# Patient Record
Sex: Female | Born: 1935
Health system: Southern US, Community
[De-identification: ages and names within clinical notes are randomized; demographics above are authoritative.]

## PROBLEM LIST (undated history)

## (undated) DIAGNOSIS — M438X9 Other specified deforming dorsopathies, site unspecified: Secondary | ICD-10-CM

## (undated) DIAGNOSIS — I10 Essential (primary) hypertension: Secondary | ICD-10-CM

## (undated) DIAGNOSIS — M810 Age-related osteoporosis without current pathological fracture: Secondary | ICD-10-CM

## (undated) HISTORY — PX: KYPHOPLASTY: SHX5884

## (undated) HISTORY — DX: Essential (primary) hypertension: I10

## (undated) HISTORY — PX: OTHER SURGICAL HISTORY: SHX169

---
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
## 1958-12-11 HISTORY — PX: DG GALL BLADDER: HXRAD326

## 2019-10-16 ENCOUNTER — Ambulatory Visit (INDEPENDENT_AMBULATORY_CARE_PROVIDER_SITE_OTHER): Payer: Medicare HMO

## 2019-10-16 ENCOUNTER — Other Ambulatory Visit: Payer: Self-pay

## 2019-10-16 ENCOUNTER — Ambulatory Visit (INDEPENDENT_AMBULATORY_CARE_PROVIDER_SITE_OTHER): Payer: Medicare HMO | Admitting: Orthopedic Surgery

## 2019-10-16 DIAGNOSIS — M545 Low back pain, unspecified: Secondary | ICD-10-CM

## 2019-10-16 DIAGNOSIS — Z8781 Personal history of (healed) traumatic fracture: Secondary | ICD-10-CM | POA: Diagnosis not present

## 2019-10-16 DIAGNOSIS — G8929 Other chronic pain: Secondary | ICD-10-CM

## 2019-10-16 NOTE — Progress Notes (Signed)
Office Visit Note   Patient: Julia Clark           Date of Birth: 12-02-1930           MRN: [id number] Visit Date: 10/16/2019 Requested by: No referring provider defined for this encounter. PCP: Mellody Dance, DO  Subjective: Chief Complaint  Patient presents with  . Lower Back - Pain  . Middle Back - Pain    HPI: Leotha Shorr is a 85 y.o. female who presents to the office complaining of history of back pain.  Patient notes that she just moved to the area from [place] and wants to establish care with the office.  She has a history of severe osteoporosis with a history of multiple lumbar compression fractures that have been treated with kyphoplasty 3 times.  These lumbar compression fractures were not associated with any injuries.  She also has a history of epidural steroid injections.  She has no complaints of significant back pain today in clinic.  She gets occasional axial lumbar pain that is controlled with taking Motrin if she stands for long period of time.  Takes occasional Tylenol as well.  She has not had to take any recently.  She has had DEXA scans every 2 years with the last DEXA scan showing a T score of -4.5 according to her.  She has taken Fosamax before, up to 9 years.  She is not on Fosamax right now.  She denies any history of back surgeries besides the kyphoplasty's.              ROS:  All systems reviewed are negative as they relate to the chief complaint within the history of present illness.  Patient denies fevers or chills.  Assessment & Plan: Visit Diagnoses:  1. History of compression fracture of spine   2. Chronic low back pain, unspecified back pain laterality, unspecified whether sciatica present     Plan: Patient is an 85 year old female who presents with history of back pain to establish care with this office.  She has history of severe osteoporosis.  Baseline x-rays of the lumbar and thoracic spine obtained today.  X-rays show evidence of prior  compression fractures and kyphoplasties.  Patient has no significant findings on exam in clinic today.  Will order DEXA scan.  She has not on any medication for osteoporosis at this time.  Recommended that patient follow-up with her PCP regarding the DEXA scan and to decide on further treatment for osteoporosis.  Patient agreed with plan and will follow up with the office as needed.  Patient may need to be on another agent.  Her mechanism of injury for the compression fractures in the thoracic and lumbar spine was minimal at best.  Currently she is asymptomatic particularly in regards to both hips which would potentially be at risk for stress fracture due to her Fosamax treatment.  No symptoms there today.  Follow-Up Instructions: No follow-ups on file.   Orders:  Orders Placed This Encounter  Procedures  . XR Thoracic Spine 2 View  . XR Lumbar Spine 2-3 Views   No orders of the defined types were placed in this encounter.     Procedures: No procedures performed   Clinical Data: No additional findings.  Objective: Vital Signs: There were no vitals taken for this visit.  Physical Exam:  Constitutional: Patient appears well-developed HEENT:  Head: Normocephalic Eyes:EOM are normal Neck: Normal range of motion Cardiovascular: Normal rate Pulmonary/chest: Effort normal Neurologic: Patient is alert Skin:  Skin is warm Psychiatric: Patient has normal mood and affect  Ortho Exam:  No tenderness throughout the axial lumbar spine or paraspinal musculature 5/5 motor strength of the bilateral hip flexor, quadriceps, dorsiflexion, plantarflexion Sensation intact in all dermatomes throughout the bilateral lower extremities Normal reflexes through bilateral lower extremities Negative straight leg raise bilaterally  Specialty Comments:  No specialty comments available.  Imaging: No results found.   PMFS History: There are no active problems to display for this patient.  No past  medical history on file.  No family history on file.   Social History   Occupational History  . Not on file  Tobacco Use  . Smoking status: Not on file  Substance and Sexual Activity  . Alcohol use: Not on file  . Drug use: Not on file  . Sexual activity: Not on file

## 2019-10-17 ENCOUNTER — Encounter: Payer: Self-pay | Admitting: Orthopedic Surgery

## 2019-10-28 DIAGNOSIS — R69 Illness, unspecified: Secondary | ICD-10-CM | POA: Diagnosis not present

## 2019-11-17 ENCOUNTER — Ambulatory Visit (INDEPENDENT_AMBULATORY_CARE_PROVIDER_SITE_OTHER): Payer: Medicare HMO | Admitting: Family Medicine

## 2019-11-17 ENCOUNTER — Encounter: Payer: Self-pay | Admitting: Family Medicine

## 2019-11-17 ENCOUNTER — Other Ambulatory Visit: Payer: Self-pay

## 2019-11-17 VITALS — BP 156/92 | HR 77 | Temp 97.4°F | Resp 12 | Ht 63.39 in | Wt 130.9 lb

## 2019-11-17 DIAGNOSIS — Z5320 Procedure and treatment not carried out because of patient's decision for unspecified reasons: Secondary | ICD-10-CM | POA: Insufficient documentation

## 2019-11-17 DIAGNOSIS — E785 Hyperlipidemia, unspecified: Secondary | ICD-10-CM

## 2019-11-17 DIAGNOSIS — Z7689 Persons encountering health services in other specified circumstances: Secondary | ICD-10-CM | POA: Diagnosis not present

## 2019-11-17 DIAGNOSIS — M810 Age-related osteoporosis without current pathological fracture: Secondary | ICD-10-CM

## 2019-11-17 DIAGNOSIS — E559 Vitamin D deficiency, unspecified: Secondary | ICD-10-CM | POA: Insufficient documentation

## 2019-11-17 DIAGNOSIS — Z8679 Personal history of other diseases of the circulatory system: Secondary | ICD-10-CM | POA: Diagnosis not present

## 2019-11-17 DIAGNOSIS — I10 Essential (primary) hypertension: Secondary | ICD-10-CM | POA: Diagnosis not present

## 2019-11-17 DIAGNOSIS — M1990 Unspecified osteoarthritis, unspecified site: Secondary | ICD-10-CM | POA: Diagnosis not present

## 2019-11-17 DIAGNOSIS — Z8261 Family history of arthritis: Secondary | ICD-10-CM | POA: Insufficient documentation

## 2019-11-17 DIAGNOSIS — Z820 Family history of epilepsy and other diseases of the nervous system: Secondary | ICD-10-CM | POA: Insufficient documentation

## 2019-11-17 DIAGNOSIS — Z806 Family history of leukemia: Secondary | ICD-10-CM | POA: Insufficient documentation

## 2019-11-17 DIAGNOSIS — Z9889 Other specified postprocedural states: Secondary | ICD-10-CM | POA: Insufficient documentation

## 2019-11-17 DIAGNOSIS — Z9049 Acquired absence of other specified parts of digestive tract: Secondary | ICD-10-CM | POA: Insufficient documentation

## 2019-11-17 NOTE — Progress Notes (Signed)
New patient office visit note:  Impression and Recommendations:    1. Establishing care with new doctor, encounter for   2. Hypertension, unspecified type   3. Hyperlipidemia, unspecified hyperlipidemia type   4. Personal history of atrial fibrillation   5. Vitamin D deficiency   6. Osteoporosis of multiple sites   7. Refuses treatment- with bisphosphonates or Prolia for her osteoporosis etc.   8. Osteoarthritis, unspecified osteoarthritis type, unspecified site      Encounter to Establish Care with New Doctor - Extensive discussion held with patient regarding establishing as a new patient.  Discussed policies and practices here at the clinic, and answered all questions about care team and health management during appointment.  - Discussed need for patient to continue to obtain management and screenings with all established specialists.  Educated patient at length about the critical importance of keeping health maintenance up to date.  - Participated in lengthy conversation and all questions were answered.   Specific discharge instructions given to the patient today: Get in touch with Dr. Marlou Sa of Orthopedics, and ask who he can send you to for consultation regarding future Kyphoplasty when/ if needed.  Try to engage in 30-60 minutes of weight-bearing exercise, five days per week.  Be sure to walk on flat, even, forgiving ground.  Remember to be careful while walking and utilize a walker or other assistance if needed.  Weight lifting exercises also can be done 2 days/week  ---> Please have a list of all past blood pressure medicines that you may have been on that did not work well for you and/or you did not tolerate.  We certainly would not want to retry medicines that you already tried.   --> Please be checking your blood pressure and pulse daily.  Write it down.  Please have this readily available whenever we meet in the future.  Also please make sure you see the below  information on how and when to check your blood pressure and also, please also consider checking a blood pressure if you ever feel funny, dizzy, lightheaded, headache etc. Etc.   Hypertension - BP elevated on intake today. - Reviewed goal BP with patient today.  - Counseled patient on pathophysiology of disease and discussed various treatment options, which always includes dietary and lifestyle modification as first line.   - Lifestyle changes such as dash, low-sodium, and heart healthy diets and engaging in a regular exercise program discussed extensively with patient.   - Ambulatory blood pressure monitoring encouraged at least twice per day.  Keep log and bring in every office visit.  Reminded patient that if they ever feel poorly in any way, to check their blood pressure and pulse.  - Handouts provided at patient's desire and/or told to go online at the [facility] website for further information  - Will continue to monitor and modify treatment plan in near future as needed.   Osteoporosis - Vertebral Fractures in Spine, h/o Kyphoplasty - Education provided to patient today regarding osteoporosis. - Encouraged patient to continue to follow up with Orthopedics as established-Dr. Marlou Sa. - Patient knows to follow all recommendations of specialist.  - Encouraged patient to continue supplementation as recommended. - Patient declines use of osteoporosis medications today.  - Told patient to call Dr. Marlou Sa of Orthopedics and ask for additional recommendations of a physician that does kyphoplasty's in the area. - Encouraged patient to consult with Orthopedics regarding future need for Kyphoplasty.  - Encouraged patient  to engage in regular weight-bearing exercise. -Discussed not only lifestyle modifications but also osteoporosis prudent diet to help improve bone health - Discussed critical importance of minimizing patient's risk of falling. - Told patient to use a walker and  movement assistance PRN.   Education and routine counseling performed. Handouts provided.   Lifestyle & Preventative Health Maintenance - Advised patient to continue working toward prudent exercising to improve overall mental, physical, and emotional health.  Told patient to walk 30-60 minutes per day, at least 5 days per week.(Patient used to exercise for an hour 3 times weekly)  - Reviewed the "spokes of the wheel" of mood and health management.  Stressed the importance of ongoing prudent habits, including regular exercise, appropriate sleep hygiene, healthful dietary habits, and prayer/meditation to relax.  - Encouraged patient to engage in daily physical activity as tolerated, especially a formal exercise routine.  Recommended that the patient eventually strive for at least 150 minutes of moderate cardiovascular activity per week according to guidelines established by the [facility].   - Healthy dietary habits encouraged, including low-carb, and high amounts of lean protein in diet.  - Encouraged patient to obtain adequate nutrition.  - Patient should also consume adequate amounts of water.  - Health counseling performed.  All questions answered.   Orders Placed This Encounter  Procedures  . CBC with Diff/Plt  . Comprehensive metabolic panel  . Lipid panel  . Phosphorus  . Magnesium  . TSH  . T4, free- Future  . VITAMIN D 25 Hydroxy (Vit-D Deficiency, Fractures)  . Vit B12     Expresses verbal understanding and consents to current therapy plan and treatment regimen.  Return:  check BP & log until- f/up in 1 mo for FBW, w/ DOXY visit 3 days later to review.  Asked cma to place future orders  Please see AVS handed out to patient at the end of our visit for further patient instructions/ counseling done pertaining to today's office visit.    Note:  This document was prepared using Dragon voice recognition software and may include unintentional dictation errors.  This document  serves as a record of services personally performed by Mellody Dance, DO. It was created on her behalf by Toni Amend, a trained medical scribe. The creation of this record is based on the scribe's personal observations and the provider's statements to them.   This case required medical decision making of at least moderate complexity. The above documentation has been reviewed to be accurate and was completed by Marjory Sneddon, D.O.     ---------------------------------------------------------------------------------------------------------------------------------------------------------------------------------------------    Subjective:   Phillips Odor, am serving as scribe for Dr. Mellody Dance.  Chief complaint:   Chief Complaint  Patient presents with  . New Patient (Initial Visit)     HPI: Rylah Poth is a pleasant 85 y.o. female who presents to [facility] today to review their medical history with me and establish care.   I asked the patient to review their chronic problem list with me to ensure everything was updated and accurate.    All recent office visits with other providers, any medical records that patient brought in etc  - I reviewed today.     We asked pt to get [place] their medical records from [facility] providers/ specialists that they had seen within the past 3-5 years- if they are in private practice and/or do not work for [employer], [facility], [facility], [facility] or [employer] owned practice.  Told them to call their specialists to clarify this if they are not sure.    Reason for Establishing Care: Notes recently moved to the area.  Patient is mother-in-law to my patient Maree Krabbe pastor.  - Husband Eddie Dibbles I also see  -Patient and husband both live with Josph Macho and Eustaquio Maize his wife.  Recently moved from [place] Notes she is from [place]. Moved down here on October 3.  Lives with her husband Eddie Dibbles, daughter,  and son-in-law.  Also has three sons. Her sons live around the world. Two live in [place] and one is a Personal assistant. Says she has a wonderful family and very grateful for them.  Says she was "crumbling in the back," which is why she and her husband Eddie Dibbles moved down here.  Tobacco & Alcohol Use Never smoker, never drank heavily.   Past Medical History  Denies history of cancer, heart disease/ami, diabetes etc.   - Hypertension Diagnosed 3-5 years ago. Takes metoprolol, 1/2 in morning, 1/2 in evening.  Was prescribed "something for water or water retention" and states "shortly after I took it, I got overwhelming dizziness, so I haven't been able to complete working on it with her."  Has not been managed on other medications that she knows of.  Patient states she does monitor her BP at home.  It is typically in the 140's at home, even with medication.  Thinks her blood pressure has been running consistently elevated for the past three years.  Says she "tends to have dizziness, a little bit."  Notes recently after she drank tap water, she "could not walk properly for five hours, from five to ten," and had to hold on to things.  Notes she drinks distilled water "and that works for me."   - History of [facility] in [place] she ended up in the hospital for atrial fibrillation and "it went away."  Says "there was no medicine" and denies there was an ablation.   - Osteoporosis, Vertebral Fractures in Spine States she has severe osteoporosis at "minus four."  "It used to be minus five but now it's to minus four."  Was managed on Fosamax in the past, for nine years.  Notes "when I had this operation (in her back), the first one before the Fosamax healed beautifully."  Indicates the second one did not heal as well.  She cannot remember when she discontinued Fosamax, but states she thinks she stopped it 4-5 years ago.   - Says she couldn't bring herself to use Prolia as an  alternative.   - Says she doesn't trust the Prolia because it has the same side effects as Fosamax.  Patient has been told in past by her doctors she needs to be managed on medications for her bones, but states "I don't want to do that."  Confirms history of arthritis, surgery in her spine.  Notes she saw a surgeon in the past.  Says "when I crack one, I know it; I go to the emergency room, take the CT scan, and I come back so they can fill it."  Says for "one crack, they didn't send me to the ortho surgeon right away," and she had difficulty obtaining follow-up.  Notes in the past she has had "three steroid injections," and "the third one worked."  - Exercise Habits Notes she hasn't been able to exercise lately; used to do group exercise 3 days per week for an hour.  States "I need to find another  exercise group, I've just got to."    Wt Readings from Last 3 Encounters:  11/17/19 130 lb 14.4 oz (59.4 kg)   BP Readings from Last 3 Encounters:  11/17/19 (!) 156/92   Pulse Readings from Last 3 Encounters:  11/17/19 77   BMI Readings from Last 3 Encounters:  11/17/19 22.91 kg/m    Patient Care Team    Relationship Specialty Notifications Start End  Mellody Dance, DO PCP - General Family Medicine  10/16/19   Meredith Pel, MD Consulting Physician Orthopedic Surgery  11/17/19    Comment: saw him for back pain/ osteoporotic spine fx    Patient Active Problem List   Diagnosis Date Noted  . Hypertension 11/17/2019    Priority: High  . Hyperlipidemia 11/17/2019    Priority: High  . Osteoporosis of multiple sites 11/17/2019    Priority: Medium  . Refuses treatment- with bisphosphonates or Prolia etc. 11/17/2019    Priority: Medium  . Vitamin D deficiency 11/17/2019    Priority: Medium  . Status post kyphoplasty 11/17/2019    Priority: Medium  . Osteoarthritis 11/17/2019    Priority: Low  . Personal history of atrial fibrillation 11/17/2019    Priority: Low  .  Family history of leukemia 11/17/2019  . Family history of Alzheimer's disease 11/17/2019  . Family history of arthritis 11/17/2019  . Hx of cholecystectomy 11/17/2019  . History of appendectomy 11/17/2019       As reported by pt:  Past Medical History:  Diagnosis Date  . HTN (hypertension)      Past Surgical History:  Procedure Laterality Date  . 3 finger joint replaced     . APPENDECTOMY  1944  . DG GALL BLADDER  1960  . KYPHOPLASTY       Family History  Problem Relation Age of Onset  . Cancer Father   . Alzheimer's disease Sister   . Arthritis Paternal Grandmother      Social History   Substance and Sexual Activity  Drug Use Not Currently     Social History   Substance and Sexual Activity  Alcohol Use Not Currently     Social History   Tobacco Use  Smoking Status Never Smoker  Smokeless Tobacco Never Used     Current Meds  Medication Sig  . Biotin w/ Vitamins C & E (HAIR/SKIN/NAILS) 1250-7.5-7.5 MCG-MG-UNT CHEW Chew by mouth.  . Calcium-Magnesium-Vitamin D (CALCIUM 1200+D3 PO) Take by mouth.  . Magnesium 100 MG CAPS Take by mouth.  . metoprolol succinate (TOPROL-XL) 50 MG 24 hr tablet Take 50 mg by mouth daily.  . Multiple Vitamin (MULTIVITAMIN WITH MINERALS) TABS tablet Take 1 tablet by mouth daily.  . vitamin B-12 (CYANOCOBALAMIN) 100 MCG tablet Take 100 mcg by mouth daily.    Allergies: Ciprocin-fluocin-procin [fluocinolone], Fosamax [alendronate sodium], and Keflex [cephalexin]   Review of Systems  Constitutional: Negative for chills, diaphoresis, fever, malaise/fatigue and weight loss.  HENT: Negative for congestion, sore throat and tinnitus.   Eyes: Negative for blurred vision, double vision and photophobia.  Respiratory: Negative for cough and wheezing.   Cardiovascular: Negative for chest pain and palpitations.  Gastrointestinal: Negative for blood in stool, diarrhea, nausea and vomiting.  Genitourinary: Negative for dysuria,  frequency and urgency.  Musculoskeletal: Negative for joint pain and myalgias.  Skin: Negative for itching and rash.  Neurological: Negative for dizziness, focal weakness, weakness and headaches.  Endo/Heme/Allergies: Negative for environmental allergies and polydipsia. Does not bruise/bleed easily.  Psychiatric/Behavioral: Negative for depression and  memory loss. The patient is not nervous/anxious and does not have insomnia.         Objective:   Blood pressure (!) 156/92, pulse 77, temperature (!) 97.4 F (36.3 C), temperature source Oral, resp. rate 12, height 5' 3.39" (1.61 m), weight 130 lb 14.4 oz (59.4 kg), SpO2 98 %. Body mass index is 22.91 kg/m. General: Well Developed, well nourished, and in no acute distress.  Neuro: Alert and oriented x3, extra-ocular muscles intact, sensation grossly intact.  HEENT:[place]/AT, PERRLA, neck supple, No carotid bruits Skin: no gross rashes  Cardiac: Regular rate and rhythm Respiratory: Essentially clear to auscultation bilaterally. Not using accessory muscles, speaking in full sentences.  Abdominal: not grossly distended Musculoskeletal: Ambulates w/o diff, FROM * 4 ext.  Vasc: less 2 sec cap RF, warm and pink  Psych:  No HI/SI, judgement and insight good, Euthymic mood. Full Affect.    No results found for this or any previous visit (from the past 2160 hour(s)).

## 2019-11-17 NOTE — Patient Instructions (Addendum)
Get in touch with Dr. Marlou Sa of Orthopedics, and ask who he can send you to for consultation regarding future Kyphoplasty when/ if needed.  Try to engage in 30-60 minutes of weight-bearing exercise, five days per week.  Be sure to walk on flat, even, forgiving ground.  Remember to be careful while walking and utilize a walker or other assistance if needed.  Weight lifting exercises also can be done 2 days/week   ---> Please have a list of all past blood pressure medicines that you may have been on that did not work well for you and/or you did not tolerate.  We certainly would not want to retry medicines that you already tried.   --> Please be checking your blood pressure and pulse daily.  Write it down.  Please have this readily available whenever we meet in the future.  Also please make sure you see the below information on how and when to check your blood pressure and also, please also consider checking a blood pressure if you ever feel funny, dizzy, lightheaded, headache etc. Etc.       Eating Plan for Osteoporosis Osteoporosis causes your bones to become weak and brittle. This puts you at greater risk for bone breaks (fractures) from small bumps or falls. Making changes to your diet and increasing your physical activity can help strengthen your bones and improve your overall health. Calcium and vitamin D are nutrients that play an important role in bone health. Vitamin D helps your body use calcium and strengthen bones. Therefore, it is important to get enough calcium and vitamin D as part of your eating plan for osteoporosis. What are tips for following this plan? Reading food labels  Try to get at least 1,000 milligrams (mg) of calcium each day.  Look for foods that have at least 50 mg of calcium per serving.  Talk with your health care provider about taking a calcium supplement if you do not get enough calcium from food.  Do not have more than 2,500 mg of calcium each day. This is  the upper limit for food and nutritional supplements combined. Too much calcium may cause constipation and prevent you from absorbing other important nutrients.  Choose foods that contain vitamin D.  Take a daily vitamin supplement that contains 800-1,000 international units (IU) of vitamin D. The amount may be different depending on your age, body weight, ethnicity, and where you live. Talk with your dietitian or health care provider about how much vitamin D is right for you.  Avoid foods that have more than 300 mg of sodium per serving. Too much sodium can cause your body to lose calcium.  Talk with your dietitian or health care provider about how much sodium you are allowed each day. Shopping  Do not buy foods with added salt, including: ? Salted snacks. ? Angie Fava. ? Canned soups. ? Canned meats. ? Processed meats, such as bacon or cold cuts. ? Smoked fish. Meal planning  Eat balanced meals that contain protein foods, fruits and vegetables, and foods rich in calcium and vitamin D.  Eat at least 5 servings of fruits and vegetables each day.  Eat 5-6 oz. of lean meat, poultry, fish, eggs, or beans each day. Lifestyle  Do not use any products that contain nicotine or tobacco, such as cigarettes and e-cigarettes. If you need help quitting, ask your health care provider.  If your health care provider recommends that you lose weight: ? Work with a dietitian to develop an eating  plan that will help you reach your desired weight goal. ? Exercise for at least 30 minutes a day, 5 or more days a week, or as told by your health care provider.  Work with a physical therapist to develop an exercise plan that includes flexibility, balance, and strength exercises.  If you drink alcohol, limit how much you have. This means: ? 0-1 drink a day for women. ? 0-2 drinks a day for men. ? Be aware of how much alcohol is in your drink. In the [place] one drink equals one typical bottle of beer (12 oz),  one-half glass of wine (5 oz), or one shot of hard liquor (1 oz). What foods should I eat? Foods high in calcium   Yogurt. Yogurt with fruit.  Milk. Evaporated skim milk. Dry milk powder.  Calcium-fortified orange juice.  Parmesan cheese. Part-skim ricotta cheese. Natural hard cheese. Cream cheese. Cottage cheese.  Canned sardines. Canned salmon.  Calcium-treated tofu. Calcium-fortified cereal bar. Calcium-fortified cereal. Calcium-fortified graham crackers.  Cooked collard greens. Turnip greens. Broccoli. Kale.  Almonds.  White beans.  Corn tortilla. Foods high in vitamin D  Cod liver oil. Fatty fish, such as tuna, mackerel, and salmon.  Milk. Fortified soy milk. Fortified fruit juice.  Yogurt. Margarine.  Egg yolks. Foods high in protein  Beef. Lamb. Pork tenderloin.  Chicken breast.  Tuna (canned). Fish fillet.  Tofu.  Soy beans (cooked). Soy patty. Beans (canned or cooked).  Cottage cheese.  Yogurt.  Peanut butter.  Pumpkin seeds. Nuts. Sunflower seeds.  Hard cheese.  Milk or other milk products, such as soy milk. The items listed above may not be a complete list of foods and beverages you can eat. Contact a dietitian for more options. Summary  Calcium and vitamin D are nutrients that play an important role in bone health and are an important part of your eating plan for osteoporosis.  Eat balanced meals that contain protein foods, fruits and vegetables, and foods rich in calcium and vitamin D.  Avoid foods that have more than 300 mg of sodium per serving. Too much sodium can cause your body to lose calcium.  Exercise is an important part of prevention and treatment of osteoporosis. Aim for at least 30 minutes a day, 5 days a week. This information is not intended to replace advice given to you by your health care provider. Make sure you discuss any questions you have with your health care provider. Document Released: 02/04/2018 Document  Revised: 02/04/2018 Document Reviewed: 02/04/2018 Elsevier Patient Education  2020 [employer].      Your goal blood pressure should be 135/85 or less on a regular basis, her medications should be started.  Normal blood pressure is 120/80 or less.    Hypertension Hypertension, commonly called high blood pressure, is when the force of blood pumping through the arteries is too strong. The arteries are the blood vessels that carry blood from the heart throughout the body. Hypertension forces the heart to work harder to pump blood and may cause arteries to become narrow or stiff. Having untreated or uncontrolled hypertension can cause heart attacks, strokes, kidney disease, and other problems. A blood pressure reading consists of a higher number over a lower number. Ideally, your blood pressure should be below 120/80. The first ("top") number is called the systolic pressure. It is a measure of the pressure in your arteries as your heart beats. The second ("bottom") number is called the diastolic pressure. It is a measure of the pressure  in your arteries as the heart relaxes. What are the causes? The cause of this condition is not known. What increases the risk? Some risk factors for high blood pressure are under your control. Others are not. Factors you can change  Smoking.  Having type 2 diabetes mellitus, high cholesterol, or both.  Not getting enough exercise or physical activity.  Being overweight.  Having too much fat, sugar, calories, or salt (sodium) in your diet.  Drinking too much alcohol. Factors that are difficult or impossible to change  Having chronic kidney disease.  Having a family history of high blood pressure.  Age. Risk increases with age.  Race. You may be at higher risk if you are African-American.  Gender. Men are at higher risk than women before age 65. After age 42, women are at higher risk than men.  Having obstructive sleep apnea.  Stress. What  are the signs or symptoms? Extremely high blood pressure (hypertensive crisis) may cause:  Headache.  Anxiety.  Shortness of breath.  Nosebleed.  Nausea and vomiting.  Severe chest pain.  Jerky movements you cannot control (seizures).  How is this diagnosed? This condition is diagnosed by measuring your blood pressure while you are seated, with your arm resting on a surface. The cuff of the blood pressure monitor will be placed directly against the skin of your upper arm at the level of your heart. It should be measured at least twice using the same arm. Certain conditions can cause a difference in blood pressure between your right and left arms. Certain factors can cause blood pressure readings to be lower or higher than normal (elevated) for a short period of time:  When your blood pressure is higher when you are in a health care provider's office than when you are at home, this is called white coat hypertension. Most people with this condition do not need medicines.  When your blood pressure is higher at home than when you are in a health care provider's office, this is called masked hypertension. Most people with this condition may need medicines to control blood pressure.  If you have a high blood pressure reading during one visit or you have normal blood pressure with other risk factors:  You may be asked to return on a different day to have your blood pressure checked again.  You may be asked to monitor your blood pressure at home for 1 week or longer.  If you are diagnosed with hypertension, you may have other blood or imaging tests to help your health care provider understand your overall risk for other conditions. How is this treated? This condition is treated by making healthy lifestyle changes, such as eating healthy foods, exercising more, and reducing your alcohol intake. Your health care provider may prescribe medicine if lifestyle changes are not enough to get your  blood pressure under control, and if:  Your systolic blood pressure is above 130.  Your diastolic blood pressure is above 80.  Your personal target blood pressure may vary depending on your medical conditions, your age, and other factors. Follow these instructions at home: Eating and drinking  Eat a diet that is high in fiber and potassium, and low in sodium, added sugar, and fat. An example eating plan is called the DASH (Dietary Approaches to Stop Hypertension) diet. To eat this way: ? Eat plenty of fresh fruits and vegetables. Try to fill half of your plate at each meal with fruits and vegetables. ? Eat whole grains, such as  whole wheat pasta, brown rice, or whole grain bread. Fill about one quarter of your plate with whole grains. ? Eat or drink low-fat dairy products, such as skim milk or low-fat yogurt. ? Avoid fatty cuts of meat, processed or cured meats, and poultry with skin. Fill about one quarter of your plate with lean proteins, such as fish, chicken without skin, beans, eggs, and tofu. ? Avoid premade and processed foods. These tend to be higher in sodium, added sugar, and fat.  Reduce your daily sodium intake. Most people with hypertension should eat less than 1,500 mg of sodium a day.  Limit alcohol intake to no more than 1 drink a day for nonpregnant women and 2 drinks a day for men. One drink equals 12 oz of beer, 5 oz of wine, or 1 oz of hard liquor. Lifestyle  Work with your health care provider to maintain a healthy body weight or to lose weight. Ask what an ideal weight is for you.  Get at least 30 minutes of exercise that causes your heart to beat faster (aerobic exercise) most days of the week. Activities may include walking, swimming, or biking.  Include exercise to strengthen your muscles (resistance exercise), such as pilates or lifting weights, as part of your weekly exercise routine. Try to do these types of exercises for 30 minutes at least 3 days a week.  Do  not use any products that contain nicotine or tobacco, such as cigarettes and e-cigarettes. If you need help quitting, ask your health care provider.  Monitor your blood pressure at home as told by your health care provider.  Keep all follow-up visits as told by your health care provider. This is important. Medicines  Take over-the-counter and prescription medicines only as told by your health care provider. Follow directions carefully. Blood pressure medicines must be taken as prescribed.  Do not skip doses of blood pressure medicine. Doing this puts you at risk for problems and can make the medicine less effective.  Ask your health care provider about side effects or reactions to medicines that you should watch for. Contact a health care provider if:  You think you are having a reaction to a medicine you are taking.  You have headaches that keep coming back (recurring).  You feel dizzy.  You have swelling in your ankles.  You have trouble with your vision. Get help right away if:  You develop a severe headache or confusion.  You have unusual weakness or numbness.  You feel faint.  You have severe pain in your chest or abdomen.  You vomit repeatedly.  You have trouble breathing. Summary  Hypertension is when the force of blood pumping through your arteries is too strong. If this condition is not controlled, it may put you at risk for serious complications.  Your personal target blood pressure may vary depending on your medical conditions, your age, and other factors. For most people, a normal blood pressure is less than 120/80.  Hypertension is treated with lifestyle changes, medicines, or a combination of both. Lifestyle changes include weight loss, eating a healthy, low-sodium diet, exercising more, and limiting alcohol. This information is not intended to replace advice given to you by your health care provider. Make sure you discuss any questions you have with your  health care provider. Document Released: 11/27/2005 Document Revised: 10/25/2016 Document Reviewed: 10/25/2016 Elsevier Interactive Patient Education  2018 [employer].    How to Take Your Blood Pressure   Blood pressure is a  measurement of how strongly your blood is pressing against the walls of your arteries. Arteries are blood vessels that carry blood from your heart throughout your body. Your health care provider takes your blood pressure at each office visit. You can also take your own blood pressure at home with a blood pressure machine. You may need to take your own blood pressure:  To confirm a diagnosis of high blood pressure (hypertension).  To monitor your blood pressure over time.  To make sure your blood pressure medicine is working.  Supplies needed: To take your blood pressure, you will need a blood pressure machine. You can buy a blood pressure machine, or blood pressure monitor, at most drugstores or online. There are several types of home blood pressure monitors. When choosing one, consider the following:  Choose a monitor that has an arm cuff.  Choose a monitor that wraps snugly around your upper arm. You should be able to fit only one finger between your arm and the cuff.  Do not choose a monitor that measures your blood pressure from your wrist or finger.  Your health care provider can suggest a reliable monitor that will meet your needs. How to prepare To get the most accurate reading, avoid the following for 30 minutes before you check your blood pressure:  Drinking caffeine.  Drinking alcohol.  Eating.  Smoking.  Exercising.  Five minutes before you check your blood pressure:  Empty your bladder.  Sit quietly without talking in a dining chair, rather than in a soft couch or armchair.  How to take your blood pressure To check your blood pressure, follow the instructions in the manual that came with your blood pressure monitor. If you have a  digital blood pressure monitor, the instructions may be as follows: 1. Sit up straight. 2. Place your feet on the floor. Do not cross your ankles or legs. 3. Rest your left arm at the level of your heart on a table or desk or on the arm of a chair. 4. Pull up your shirt sleeve. 5. Wrap the blood pressure cuff around the upper part of your left arm, 1 inch (2.5 cm) above your elbow. It is best to wrap the cuff around bare skin. 6. Fit the cuff snugly around your arm. You should be able to place only one finger between the cuff and your arm. 7. Position the cord inside the groove of your elbow. 8. Press the power button. 9. Sit quietly while the cuff inflates and deflates. 10. Read the digital reading on the monitor screen and write it down (record it). 11. Wait 2-3 minutes, then repeat the steps, starting at step 1.  What does my blood pressure reading mean? A blood pressure reading consists of a higher number over a lower number. Ideally, your blood pressure should be below 120/80. The first ("top") number is called the systolic pressure. It is a measure of the pressure in your arteries as your heart beats. The second ("bottom") number is called the diastolic pressure. It is a measure of the pressure in your arteries as the heart relaxes. Blood pressure is classified into four stages. The following are the stages for adults who do not have a short-term serious illness or a chronic condition. Systolic pressure and diastolic pressure are measured in a unit called mm Hg. Normal  Systolic pressure: below 123456.  Diastolic pressure: below 80. Elevated  Systolic pressure: [id number].  Diastolic pressure: below 80. Hypertension stage 1  Systolic pressure: 0000000.  Diastolic pressure: [id number]. Hypertension stage 2  Systolic pressure: [id number] or above.  Diastolic pressure: 90 or above. You can have prehypertension or hypertension even if only the systolic or only the diastolic number in your reading  is higher than normal. Follow these instructions at home:  Check your blood pressure as often as recommended by your health care provider.  Take your monitor to the next appointment with your health care provider to make sure: ? That you are using it correctly. ? That it provides accurate readings.  Be sure you understand what your goal blood pressure numbers are.  Tell your health care provider if you are having any side effects from blood pressure medicine. Contact a health care provider if:  Your blood pressure is consistently high. Get help right away if:  Your systolic blood pressure is higher than 180.  Your diastolic blood pressure is higher than 110. This information is not intended to replace advice given to you by your health care provider. Make sure you discuss any questions you have with your health care provider. Document Released: 05/05/2016 Document Revised: 07/18/2016 Document Reviewed: 05/05/2016 Elsevier Interactive Patient Education  [employer].

## 2019-12-15 ENCOUNTER — Telehealth: Payer: Self-pay | Admitting: Orthopedic Surgery

## 2019-12-15 NOTE — Telephone Encounter (Signed)
Please advise. Thanks.  

## 2019-12-16 NOTE — Telephone Encounter (Signed)
Can you please make disc for patient with back images on it? Please call when it is ready to be picked up.  No charge for disc.

## 2019-12-16 NOTE — Telephone Encounter (Signed)
Deveschwar thx

## 2019-12-17 ENCOUNTER — Telehealth: Payer: Self-pay | Admitting: Orthopedic Surgery

## 2019-12-17 NOTE — Telephone Encounter (Signed)
There is a disc for pick up.  Julia Clark would like to give her verbal permission for [name] to pick this up.

## 2019-12-17 NOTE — Telephone Encounter (Signed)
Left message with patient that CD is ready for pickup

## 2019-12-19 ENCOUNTER — Other Ambulatory Visit: Payer: Self-pay

## 2019-12-19 ENCOUNTER — Other Ambulatory Visit: Payer: Medicare HMO

## 2019-12-19 DIAGNOSIS — I10 Essential (primary) hypertension: Secondary | ICD-10-CM

## 2019-12-19 DIAGNOSIS — M1990 Unspecified osteoarthritis, unspecified site: Secondary | ICD-10-CM | POA: Diagnosis not present

## 2019-12-19 DIAGNOSIS — Z8679 Personal history of other diseases of the circulatory system: Secondary | ICD-10-CM | POA: Diagnosis not present

## 2019-12-19 DIAGNOSIS — Z5320 Procedure and treatment not carried out because of patient's decision for unspecified reasons: Secondary | ICD-10-CM

## 2019-12-19 DIAGNOSIS — E559 Vitamin D deficiency, unspecified: Secondary | ICD-10-CM

## 2019-12-19 DIAGNOSIS — E785 Hyperlipidemia, unspecified: Secondary | ICD-10-CM

## 2019-12-19 DIAGNOSIS — Z7689 Persons encountering health services in other specified circumstances: Secondary | ICD-10-CM

## 2019-12-19 DIAGNOSIS — M810 Age-related osteoporosis without current pathological fracture: Secondary | ICD-10-CM | POA: Diagnosis not present

## 2019-12-20 LAB — LIPID PANEL
Chol/HDL Ratio: 2.9 ratio (ref 0.0–4.4)
Cholesterol, Total: 231 mg/dL — ABNORMAL HIGH (ref 100–199)
HDL: 80 mg/dL (ref >39)
LDL Chol Calc (NIH): 130 mg/dL — ABNORMAL HIGH (ref 0–99)
Triglycerides: 119 mg/dL (ref 0–149)
VLDL Cholesterol Cal: 21 mg/dL (ref 5–40)

## 2019-12-20 LAB — COMPREHENSIVE METABOLIC PANEL
ALT: 11 IU/L (ref 0–32)
AST: 20 IU/L (ref 0–40)
Albumin/Globulin Ratio: 1.8 (ref 1.2–2.2)
Albumin: 4.4 g/dL (ref 3.6–4.6)
Alkaline Phosphatase: 74 IU/L (ref 39–117)
BUN/Creatinine Ratio: 24 (ref 12–28)
BUN: 18 mg/dL (ref 8–27)
Bilirubin Total: 0.3 mg/dL (ref 0.0–1.2)
CO2: 26 mmol/L (ref 20–29)
Calcium: 9.8 mg/dL (ref 8.7–10.3)
Chloride: 101 mmol/L (ref 96–106)
Creatinine, Ser: 0.76 mg/dL (ref 0.57–1.00)
GFR calc Af Amer: 82 mL/min/{1.73_m2} (ref >59)
GFR calc non Af Amer: 71 mL/min/{1.73_m2} (ref >59)
Globulin, Total: 2.4 g/dL (ref 1.5–4.5)
Glucose: 94 mg/dL (ref 65–99)
Potassium: 4.2 mmol/L (ref 3.5–5.2)
Sodium: 140 mmol/L (ref 134–144)
Total Protein: 6.8 g/dL (ref 6.0–8.5)

## 2019-12-20 LAB — CBC WITH DIFFERENTIAL/PLATELET
Basophils Absolute: 0 10*3/uL (ref 0.0–0.2)
Basos: 1 %
EOS (ABSOLUTE): 0.2 10*3/uL (ref 0.0–0.4)
Eos: 3 %
Hematocrit: 35.8 % (ref 34.0–46.6)
Hemoglobin: 12.2 g/dL (ref 11.1–15.9)
Immature Grans (Abs): 0 10*3/uL (ref 0.0–0.1)
Immature Granulocytes: 0 %
Lymphocytes Absolute: 2.2 10*3/uL (ref 0.7–3.1)
Lymphs: 32 %
MCH: 31 pg (ref 26.6–33.0)
MCHC: 34.1 g/dL (ref 31.5–35.7)
MCV: 91 fL (ref 79–97)
Monocytes Absolute: 0.7 10*3/uL (ref 0.1–0.9)
Monocytes: 10 %
Neutrophils Absolute: 3.8 10*3/uL (ref 1.4–7.0)
Neutrophils: 54 %
Platelets: 239 10*3/uL (ref 150–450)
RBC: 3.94 x10E6/uL (ref 3.77–5.28)
RDW: 12.3 % (ref 11.7–15.4)
WBC: 7 10*3/uL (ref 3.4–10.8)

## 2019-12-20 LAB — T4, FREE: Free T4: 1.08 ng/dL (ref 0.82–1.77)

## 2019-12-20 LAB — VITAMIN D 25 HYDROXY (VIT D DEFICIENCY, FRACTURES): Vit D, 25-Hydroxy: 58.7 ng/mL (ref 30.0–100.0)

## 2019-12-20 LAB — TSH: TSH: 2.39 u[IU]/mL (ref 0.450–4.500)

## 2019-12-20 LAB — MAGNESIUM: Magnesium: 2 mg/dL (ref 1.6–2.3)

## 2019-12-20 LAB — VITAMIN B12: Vitamin B-12: 677 pg/mL (ref 232–1245)

## 2019-12-20 LAB — PHOSPHORUS: Phosphorus: 3.7 mg/dL (ref 3.0–4.3)

## 2019-12-23 ENCOUNTER — Ambulatory Visit (INDEPENDENT_AMBULATORY_CARE_PROVIDER_SITE_OTHER): Payer: Medicare HMO | Admitting: Family Medicine

## 2019-12-23 ENCOUNTER — Other Ambulatory Visit: Payer: Self-pay

## 2019-12-23 ENCOUNTER — Encounter: Payer: Self-pay | Admitting: Family Medicine

## 2019-12-23 VITALS — BP 127/70 | HR 74 | Ht 63.0 in | Wt 130.0 lb

## 2019-12-23 DIAGNOSIS — I10 Essential (primary) hypertension: Secondary | ICD-10-CM | POA: Diagnosis not present

## 2019-12-23 DIAGNOSIS — M810 Age-related osteoporosis without current pathological fracture: Secondary | ICD-10-CM

## 2019-12-23 DIAGNOSIS — E559 Vitamin D deficiency, unspecified: Secondary | ICD-10-CM

## 2019-12-23 DIAGNOSIS — Z5320 Procedure and treatment not carried out because of patient's decision for unspecified reasons: Secondary | ICD-10-CM

## 2019-12-23 DIAGNOSIS — E785 Hyperlipidemia, unspecified: Secondary | ICD-10-CM | POA: Diagnosis not present

## 2019-12-23 DIAGNOSIS — M1990 Unspecified osteoarthritis, unspecified site: Secondary | ICD-10-CM | POA: Diagnosis not present

## 2019-12-23 DIAGNOSIS — Z8679 Personal history of other diseases of the circulatory system: Secondary | ICD-10-CM

## 2019-12-23 MED ORDER — ATORVASTATIN CALCIUM 10 MG PO TABS: ORAL_TABLET | ORAL | 0 refills | Status: DC

## 2019-12-23 NOTE — Progress Notes (Addendum)
Telehealth office visit note for Julia Clark, D.O- at Primary Care at [facility]   I connected with current patient today and verified that I am speaking with the correct person using two identifiers.   . Location of the patient: Home . Location of the provider: Office Only the patient (+/- their family members at pt's discretion) and myself were participating in the encounter - This visit type was conducted due to national recommendations for restrictions regarding the COVID-19 Pandemic (e.g. social distancing) in an effort to limit this patient's exposure and mitigate transmission in our community.  This format is felt to be most appropriate for this patient at this time.   - The patient did not have access to video technology or had technical difficulties with video requiring transitioning to audio format only. - No physical exam could be performed with this format, beyond that communicated to [place] by the patient/ family members as noted.   - Additionally my office staff/ schedulers discussed with the patient that there may be a monetary charge related to this service, depending on their medical insurance.   The patient expressed understanding, and agreed to proceed.       History of Present Illness: Hypertension and Hyperlipidemia    I, Toni Amend, am serving as scribe for Dr. [name].   Notes feeling "very good, thank you."  - Osteoporosis Has not been engaging in daily weight bearing exercises or weight lifting.  Notes she does enjoy her exercise video, but "we just haven't been able to do it every day."  Notes she has a DEXA scan scheduled for February 9th, 2021.   HPI:  Hypertension:  -  Her blood pressure at home has been running: 124/73, pulse of 74; notes "we took it three times," additionally recording BP of 127/70, 115/69.  Has been up to 141/74, 128/69, 156/77, 132/71.  Feels on average, her BP has been 130/70 on a regular basis.  Her  pulse runs around 68, 69, 56, 70, 76, average of 70.  - Patient reports good compliance with medication and/or lifestyle modification  - Her denies acute concerns or problems related to treatment plan  - She denies new onset of: chest pain, exercise intolerance, shortness of breath, dizziness, visual changes, headache, lower extremity swelling or claudication.   Last 3 blood pressure readings in our office are as follows: BP Readings from Last 3 Encounters:  12/23/19 127/70  11/17/19 (!) 156/92   Filed Weights   12/23/19 0916  Weight: 130 lb (59 kg)     HPI:  Hyperlipidemia:  86 y.o. female here for cholesterol follow-up.   Patient has never been managed on statin in the past.  - She denies new onset of: myalgias, arthralgias, increased fatigue more than normal, chest pains, exercise intolerance, shortness of breath, dizziness, visual changes, headache, lower extremity swelling or claudication.   Most recent cholesterol panel was:  Lab Results  Component Value Date   CHOL 231 (H) 12/19/2019   HDL 80 12/19/2019   LDLCALC 130 (H) 12/19/2019   TRIG 119 12/19/2019   CHOLHDL 2.9 12/19/2019   Hepatic Function Latest Ref Rng & Units 12/19/2019  Total Protein 6.0 - 8.5 g/dL 6.8  Albumin 3.6 - 4.6 g/dL 4.4  AST 0 - 40 IU/L 20  ALT 0 - 32 IU/L 11  Alk Phosphatase 39 - 117 IU/L 74  Total Bilirubin 0.0 - 1.2 mg/dL 0.3    Depression screen [facility] 2/9 12/23/2019 11/17/2019  Decreased Interest 0 0  Down, Depressed, Hopeless 0 0  PHQ - 2 Score 0 0  Altered sleeping 0 0  Tired, decreased energy 1 0  Change in appetite 0 0  Feeling bad or failure about yourself  0 0  Trouble concentrating 0 0  Moving slowly or fidgety/restless 0 0  Suicidal thoughts 0 0  PHQ-9 Score 1 0  Difficult doing work/chores Not difficult at all Not difficult at all      Impression and Recommendations:    1. Hypertension, unspecified type   2. Hyperlipidemia, unspecified hyperlipidemia type   3.  Vitamin D deficiency   4. Personal history of atrial fibrillation   5. Osteoporosis of multiple sites   6. Refuses treatment- with bisphosphonates or Prolia for her osteoporosis etc.   7. Osteoarthritis, unspecified osteoarthritis type, unspecified site       Osteoporosis of Multiple Sites - Refuses Treatment with Bisphosphonate or Prolia - Continue calcium supplementation as established. - Weight bearing exercises encouraged 5 days per week, for 30-45 minutes. - Encouraged patient to walk on a treadmill or any flat, forgiving surface. - Advised patient to engage in weight lifting two days per week to help strengthen bones.  - Advised patient to call Dr. Marlou Sa regarding her questions for future Kyphoplasty.  - Patient will obtain DEXA in February as scheduled.  - Will continue to monitor.   Lab Review - Reviewed recent lab work (12/19/2019) in depth with patient today.  All lab work within normal limits unless otherwise noted.  Extensive education provided and all questions answered.  - Advised patient to continue all current supplementation as established.   Vitamin D Deficiency - 58.7 last check. - Discussed goal of maintenance within 40-60 range. - Patient will continue Vitamin D as established.  See med list. - Will continue to monitor and re-check as discussed.   Hyperlipidemia - Last FLP obtained four days ago: Triglycerides = 119 HDL = 80 LDL = 130, elevated.  - LDL elevated and suboptimally controlled. - Discussed goal LDL around 100 or lower.   -Discussed with patient that diet and exercise is always the mainstay of first-line treatment.  - Reviewed that elevated LDL increases risk of stroke and cardiac event, and that a low-dose statin may be worth the patient's consideration.  Per patient, has never been managed on statin in the past. -After long discussion in which I explained there is no clear-cut guidelines for patients her age in initiating statin therapy,  patient desired to start low-dose 3 days/week.  - Patient desires to begin with low-dose statin three times per week, plus diet and exercise.  Patient will take statin Monday, Wednesday, and Friday night before bed.  - To help reduce incidence of muscle aches on statins, encouraged patient to hydrate adequately and engage in regular walking / weight-bearing exercises.  - Prudent dietary changes such as low saturated & trans fat diets for hyperlipidemia and low carb diets for hypertriglyceridemia discussed with patient.    - To maintain HDL above 60, encouraged patient to follow AHA guidelines for regular exercise.   - Educational handouts provided at patient's desire and/ or told to look online at the [employer] website for further information.  - We will continue to monitor and re-check as discussed.   Hypertension - Personal History of Atrial Fibrillation - Blood pressure currently is at goal. - Denies dizziness or lightheadedness. - Patient will continue current treatment regimen.  - Counseled patient on pathophysiology of  disease and discussed various treatment options, which always includes dietary and lifestyle modification as first line.   - Lifestyle changes such as dash and heart healthy diets and engaging in a regular exercise program discussed extensively with patient.   - Ambulatory blood pressure monitoring encouraged at least 3 times weekly.  Keep log and bring in every office visit.  Reminded patient that if they ever feel poorly in any way, to check their blood pressure and pulse.  - Handouts provided at patient's desire and/or told to go online at the [employer] website for further information  - We will continue to monitor.   Recommendations - Return in 2 months to check FLP and liver enzymes.    - As part of my medical decision making, I reviewed the following data within the [facility] History obtained from pt  /family, CMA notes reviewed and incorporated if applicable, Labs reviewed, Radiograph/ tests reviewed if applicable and OV notes from prior OV's with me, as well as other specialists she/he has seen since seeing me last, were all reviewed and used in my medical decision making process today.    - Additionally, discussion had with patient regarding our treatment plan, and their biases/concerns about that plan were used in my medical decision making today.    - The patient agreed with the plan and demonstrated an understanding of the instructions.   No barriers to understanding were identified.    - Red flag symptoms and signs discussed in detail.  Patient expressed understanding regarding what to do in case of emergency\ urgent symptoms.   - The patient was advised to call back or seek an in-person evaluation if the symptoms worsen or if the condition fails to improve as anticipated.   Return in about 2 months (around 02/20/2020) for low dose statin started- recheck FLP and liver enzyme, with OV (DOXY/telephone) 3-5 days later.    Orders Placed This Encounter  Procedures  . Lipid panel  . ALT    Meds ordered this encounter  Medications  . atorvastatin (LIPITOR) 10 MG tablet    Sig: Take Mon, Wed and Fri nights before bed    Dispense:  36 tablet    Refill:  0    I provided 27 minutes of non face-to-face time during this encounter.  Additional time was spent with charting and coordination of care before and after the actual visit commenced.   Note:  This note was prepared with assistance of Dragon voice recognition software. Occasional wrong-word or sound-a-like substitutions may have occurred due to the inherent limitations of voice recognition software.  This document serves as a record of services personally performed by Julia Dance, DO. It was created on her behalf by Toni Amend, a trained medical scribe. The creation of this record is based on the scribe's personal  observations and the provider's statements to them.   This case required medical decision making of at least moderate complexity. The above documentation has been reviewed to be accurate and was completed by Marjory Sneddon, D.O.       Patient Care Team    Relationship Specialty Notifications Start End  Julia Dance, DO PCP - General Family Medicine  10/16/19   Meredith Pel, MD Consulting Physician Orthopedic Surgery  11/17/19    Comment: saw him for back pain/ osteoporotic spine fx     -Vitals obtained; medications/ allergies reconciled;  personal medical, social, Sx etc.histories were updated by CMA, reviewed by me and  are reflected in chart   Patient Active Problem List   Diagnosis Date Noted  . Hypertension 11/17/2019  . Hyperlipidemia 11/17/2019  . Osteoporosis of multiple sites 11/17/2019  . Refuses treatment- with bisphosphonates or Prolia etc. 11/17/2019  . Vitamin D deficiency 11/17/2019  . Status post kyphoplasty 11/17/2019  . Osteoarthritis 11/17/2019  . Personal history of atrial fibrillation 11/17/2019  . Family history of leukemia 11/17/2019  . Family history of Alzheimer's disease 11/17/2019  . Family history of arthritis 11/17/2019  . Hx of cholecystectomy 11/17/2019  . History of appendectomy 11/17/2019     Current Meds  Medication Sig  . Biotin w/ Vitamins C & E (HAIR/SKIN/NAILS) 1250-7.5-7.5 MCG-MG-UNT CHEW Chew by mouth.  . Calcium-Magnesium-Vitamin D (CALCIUM 1200+D3 PO) Take by mouth.  . metoprolol succinate (TOPROL-XL) 50 MG 24 hr tablet Take 50 mg by mouth daily.  . Multiple Vitamin (MULTIVITAMIN WITH MINERALS) TABS tablet Take 1 tablet by mouth daily.  . vitamin B-12 (CYANOCOBALAMIN) 100 MCG tablet Take 100 mcg by mouth daily.     Allergies:  Allergies  Allergen Reactions  . Ciprocin-Fluocin-Procin [Fluocinolone]   . Fosamax [Alendronate Sodium]   . Keflex [Cephalexin]      ROS:  See above HPI for pertinent positives and  negatives   Objective:   Blood pressure 127/70, pulse 74, height '5\' 3"'$  (1.6 m), weight 130 lb (59 kg).  (if some vitals are omitted, this means that patient was UNABLE to obtain them even though they were asked to get them prior to OV today.  They were asked to call us at their earliest convenience with these once obtained. )  General: A & O * 3; sounds in no acute distress; in usual state of health.  Skin: Pt confirms warm and dry extremities and pink fingertips HEENT: Pt confirms lips non-cyanotic Chest: Patient confirms normal chest excursion and movement Respiratory: speaking in full sentences, no conversational dyspnea; patient confirms no use of accessory muscles Psych: insight appears good, mood- appears full

## 2019-12-24 ENCOUNTER — Telehealth: Payer: Self-pay | Admitting: Orthopedic Surgery

## 2019-12-24 NOTE — Telephone Encounter (Signed)
Patient called stated her PCP wants Marlou Sa can recommend a MD to do/order a Kyprhoplasty.  Please call patient to advise.  [phone]

## 2019-12-24 NOTE — Telephone Encounter (Signed)
Should go to [facility]

## 2019-12-25 NOTE — Telephone Encounter (Signed)
Tried calling. No answer. VM is full, unable to LM. Per Dr Forbes Cellar last note he recommended Dr Luanne Bras for kyphoplasty.

## 2020-01-06 ENCOUNTER — Other Ambulatory Visit: Payer: Self-pay | Admitting: Family Medicine

## 2020-01-16 ENCOUNTER — Other Ambulatory Visit: Payer: Self-pay | Admitting: Orthopedic Surgery

## 2020-01-16 DIAGNOSIS — M810 Age-related osteoporosis without current pathological fracture: Secondary | ICD-10-CM

## 2020-01-16 DIAGNOSIS — G8929 Other chronic pain: Secondary | ICD-10-CM

## 2020-01-20 ENCOUNTER — Other Ambulatory Visit: Payer: Self-pay

## 2020-01-20 ENCOUNTER — Ambulatory Visit
Admission: RE | Admit: 2020-01-20 | Discharge: 2020-01-20 | Disposition: A | Discharge status: Home / Self Care | Payer: Medicare HMO | Source: Ambulatory Visit | Attending: Orthopedic Surgery | Admitting: Orthopedic Surgery

## 2020-01-20 DIAGNOSIS — M545 Low back pain, unspecified: Secondary | ICD-10-CM

## 2020-01-20 DIAGNOSIS — G8929 Other chronic pain: Secondary | ICD-10-CM

## 2020-01-20 DIAGNOSIS — M810 Age-related osteoporosis without current pathological fracture: Secondary | ICD-10-CM

## 2020-01-20 DIAGNOSIS — Z780 Asymptomatic menopausal state: Secondary | ICD-10-CM | POA: Diagnosis not present

## 2020-01-28 ENCOUNTER — Ambulatory Visit (INDEPENDENT_AMBULATORY_CARE_PROVIDER_SITE_OTHER): Payer: Medicare HMO

## 2020-01-28 ENCOUNTER — Other Ambulatory Visit: Payer: Self-pay

## 2020-01-28 ENCOUNTER — Encounter: Payer: Self-pay | Admitting: Orthopedic Surgery

## 2020-01-28 ENCOUNTER — Ambulatory Visit: Payer: Medicare HMO | Admitting: Orthopedic Surgery

## 2020-01-28 DIAGNOSIS — Z8781 Personal history of (healed) traumatic fracture: Secondary | ICD-10-CM

## 2020-01-28 NOTE — Progress Notes (Signed)
Office Visit Note   Patient: Julia Clark           Date of Birth: Jun 26, 1930           MRN: [id number] Visit Date: 01/28/2020 Requested by: Mellody Dance, DO [place],  [place] [id number] PCP: Mellody Dance, DO  Subjective: Chief Complaint  Patient presents with  . Back Pain    HPI: Julia Clark is an 86 year old patient with osteoporosis.  She is having some mild back pain.  DEXA scan reviewed.  T score -4.6 which is slightly worse than her previous 1.  In general she is doing reasonably well and not having enough back pain to warrant any type of kyphoplasty.  Radiographs made today.  She is having some issues with balance.  She is here with her husband              ROS: All systems reviewed are negative as they relate to the chief complaint within the history of present illness.  Patient denies  fevers or chills.   Assessment & Plan: Visit Diagnoses:  1. History of compression fracture of spine     Plan: Impression is mild low back pain with pretty functional ambulatory status without assistive devices.  No nerve root tension signs.  X-rays unchanged from November.  Discussion was made today about potentially having Dr. Lenise Arena sure do some work on the back but with her current level of symptoms I would wait until she is little bit more symptomatic before considering that.  She can call me and we will get that set up without return office visit if she wants to consider kyphoplasty or vertebroplasty for increasing back pain.  Follow-Up Instructions: No follow-ups on file.   Orders:  Orders Placed This Encounter  Procedures  . XR Lumbar Spine 2-3 Views  . XR Thoracic Spine 2 View   No orders of the defined types were placed in this encounter.     Procedures: No procedures performed   Clinical Data: No additional findings.  Objective: Vital Signs: There were no vitals taken for this visit.  Physical Exam:   Constitutional: Patient appears  well-developed HEENT:  Head: Normocephalic Eyes:EOM are normal Neck: Normal range of motion Cardiovascular: Normal rate Pulmonary/chest: Effort normal Neurologic: Patient is alert Skin: Skin is warm Psychiatric: Patient has normal mood and affect    Ortho Exam: Ortho exam demonstrates normal gait alignment.  Pedal pulses palpable.  Ankle dorsiflexion plantarflexion intact with no groin pain with internal X rotation leg.  Not much in pain with forward lateral bending.  No definite paresthesias L1 S1 bilaterally  Specialty Comments:  No specialty comments available.  Imaging: XR Thoracic Spine 2 View  Result Date: 01/28/2020 AP lateral thoracic spine reviewed.  Kyphosis unchanged from radiographs November 2020.  Kyphoplasty cement remains present in the lower thoracic upper lumbar regions.  No acute fracture visualized  XR Lumbar Spine 2-3 Views  Result Date: 01/28/2020 AP lateral lumbar spine reviewed.  Compression fracture at L2 is noted unchanged in appearance from November 2020.  Cement from prior kyphosis noted at L1.  No new acute compression fracture.  Osteopenia present.    PMFS History: Patient Active Problem List   Diagnosis Date Noted  . Osteoporosis of multiple sites 11/17/2019  . Refuses treatment- with bisphosphonates or Prolia etc. 11/17/2019  . Vitamin D deficiency 11/17/2019  . Hypertension 11/17/2019  . Osteoarthritis 11/17/2019  . Family history of leukemia 11/17/2019  . Family history of Alzheimer's  disease 11/17/2019  . Family history of arthritis 11/17/2019  . Personal history of atrial fibrillation 11/17/2019  . Status post kyphoplasty 11/17/2019  . Hx of cholecystectomy 11/17/2019  . History of appendectomy 11/17/2019  . Hyperlipidemia 11/17/2019   Past Medical History:  Diagnosis Date  . HTN (hypertension)     Family History  Problem Relation Age of Onset  . Cancer Father   . Alzheimer's disease Sister   . Arthritis Paternal Grandmother      Past Surgical History:  Procedure Laterality Date  . 3 finger joint replaced     . APPENDECTOMY  1944  . DG GALL BLADDER  1960  . KYPHOPLASTY     Social History   Occupational History  . Not on file  Tobacco Use  . Smoking status: Never Smoker  . Smokeless tobacco: Never Used  Substance and Sexual Activity  . Alcohol use: Not Currently  . Drug use: Not Currently  . Sexual activity: Not Currently

## 2020-02-16 ENCOUNTER — Other Ambulatory Visit: Payer: Self-pay

## 2020-02-16 ENCOUNTER — Other Ambulatory Visit: Payer: Medicare HMO

## 2020-02-16 DIAGNOSIS — E785 Hyperlipidemia, unspecified: Secondary | ICD-10-CM

## 2020-02-16 NOTE — Progress Notes (Signed)
Return in about 2 months (around 02/20/2020) for low dose statin started- recheck FLP and liver enzyme, with OV (DOXY/telephone) 3-5 days later.    Ordering FLP and Hepatic Function per discharge instructions on 12/23/19. AS, CMA

## 2020-02-17 LAB — HEPATIC FUNCTION PANEL
ALT: 11 IU/L (ref 0–32)
AST: 23 IU/L (ref 0–40)
Albumin: 4.4 g/dL (ref 3.6–4.6)
Alkaline Phosphatase: 83 IU/L (ref 39–117)
Bilirubin Total: 0.3 mg/dL (ref 0.0–1.2)
Bilirubin, Direct: 0.09 mg/dL (ref 0.00–0.40)
Total Protein: 6.8 g/dL (ref 6.0–8.5)

## 2020-02-17 LAB — LIPID PANEL
Chol/HDL Ratio: 2.7 ratio (ref 0.0–4.4)
Cholesterol, Total: 207 mg/dL — ABNORMAL HIGH (ref 100–199)
HDL: 78 mg/dL (ref >39)
LDL Chol Calc (NIH): 115 mg/dL — ABNORMAL HIGH (ref 0–99)
Triglycerides: 78 mg/dL (ref 0–149)
VLDL Cholesterol Cal: 14 mg/dL (ref 5–40)

## 2020-02-20 ENCOUNTER — Ambulatory Visit (INDEPENDENT_AMBULATORY_CARE_PROVIDER_SITE_OTHER): Payer: Medicare HMO | Admitting: Family Medicine

## 2020-02-20 ENCOUNTER — Encounter: Payer: Self-pay | Admitting: Family Medicine

## 2020-02-20 ENCOUNTER — Other Ambulatory Visit: Payer: Self-pay

## 2020-02-20 VITALS — Ht 63.0 in | Wt 130.0 lb

## 2020-02-20 DIAGNOSIS — E785 Hyperlipidemia, unspecified: Secondary | ICD-10-CM

## 2020-02-20 NOTE — Progress Notes (Signed)
Telehealth office visit note for Julia Clark, D.O- at Primary Care at [facility]   I connected with current patient today and verified that I am speaking with the correct person using two identifiers.   . Location of the patient: Home . Location of the provider: Office Only the patient (+/- their family members at pt's discretion) and myself were participating in the encounter - This visit type was conducted due to national recommendations for restrictions regarding the COVID-19 Pandemic (e.g. social distancing) in an effort to limit this patient's exposure and mitigate transmission in our community.  This format is felt to be most appropriate for this patient at this time.   - No physical exam could be performed with this format, beyond that communicated to [place] by the patient/ family members as noted.   - Additionally my office staff/ schedulers discussed with the patient that there may be a monetary charge related to this service, depending on their medical insurance.   The patient expressed understanding, and agreed to proceed.     _________________________________________________________________________________   History of Present Illness: No chief complaint on file.   I, Julia Clark, am serving as Education administrator for [employer].  Notes she's had heart palpitations and dizziness in the past, but her cardiac assessment was clear.  Denies dizziness or heart palpitations now.  Says in the past, to resolve the dizziness, she stopped eating oatmeal daily and "everything stopped, no dizziness whatsoever."  Says "Type O is sensitive to grains" and "there's a sensitivity there we have to deal with."  HPI:  Hyperlipidemia:  86 y.o. female here for cholesterol follow-up.   Per patient, has been taking her statins Monday, Wednesday, and Friday night, up until this past Monday.  Says she quit taking her medications this past Monday.   She thinks her legs began to hurt a little  bit while taking the statins, but she isn't sure if the statins are what caused the aching.  Feels her side-effects "weren't massive."  - Patient denies any acute concerns or problems with management plan   - She denies new onset of: myalgias, arthralgias, increased fatigue more than normal, chest pains, exercise intolerance, shortness of breath, dizziness, visual changes, headache, lower extremity swelling or claudication.   Most recent cholesterol panel was:  Lab Results  Component Value Date   CHOL 207 (H) 02/16/2020   HDL 78 02/16/2020   LDLCALC 115 (H) 02/16/2020   TRIG 78 02/16/2020   CHOLHDL 2.7 02/16/2020   Hepatic Function Latest Ref Rng & Units 02/16/2020 12/19/2019  Total Protein 6.0 - 8.5 g/dL 6.8 6.8  Albumin 3.6 - 4.6 g/dL 4.4 4.4  AST 0 - 40 IU/L 23 20  ALT 0 - 32 IU/L 11 11  Alk Phosphatase 39 - 117 IU/L 83 74  Total Bilirubin 0.0 - 1.2 mg/dL 0.3 0.3  Bilirubin, Direct 0.00 - 0.40 mg/dL 0.09 -       No flowsheet data found.  Depression screen [facility] 2/9 12/23/2019 11/17/2019  Decreased Interest 0 0  Down, Depressed, Hopeless 0 0  PHQ - 2 Score 0 0  Altered sleeping 0 0  Tired, decreased energy 1 0  Change in appetite 0 0  Feeling bad or failure about yourself  0 0  Trouble concentrating 0 0  Moving slowly or fidgety/restless 0 0  Suicidal thoughts 0 0  PHQ-9 Score 1 0  Difficult doing work/chores Not difficult at all Not difficult at all  Impression and Recommendations:    1. Hyperlipidemia, unspecified hyperlipidemia type      Hyperlipidemia - Last OV, patient was prescribed Lipitor to take Monday, Wednesday, and Friday night. - Per patient, quit taking the statins this past Monday.  - Discussed that patient's cholesterol improved, but not by much. LDL = 115, elevated, down from 130 prior. HDL = 78, down from 80 prior. Triglycerides = 78, down from 119 prior.  - Advised patient of goal LDL under 100, but as patient reported possible S-E on  statins, will discontinue Lipitor today.  Medication discontinued.  - Discussed that statins may be resumed in the future if desired, and patient may call at any time to discuss further options.  - If patient desires referral to cardiology, she knows she may call and ask at any time.  - Encouraged patient to engage in prudent lifestyle and dietary changes such as following a heart healthy diet low in saturated & trans fat.  - Encouraged patient to follow AHA guidelines for regular exercise, especially weight bearing exercises.  - We will continue to monitor and re-check in 6-12 months.      - As part of my medical decision making, I reviewed the following data within the [place] History obtained from pt /family, CMA notes reviewed and incorporated if applicable, Labs reviewed, Radiograph/ tests reviewed if applicable and OV notes from prior OV's with me, as well as other specialists she/he has seen since seeing me last, were all reviewed and used in my medical decision making process today.    - Additionally, discussion had with patient regarding our treatment plan, and their biases/concerns about that plan were used in my medical decision making today.    - The patient agreed with the plan and demonstrated an understanding of the instructions.   No barriers to understanding were identified.      Return for re-check FLP in 6-12 months, after working on heart healthy diet and exercise.    Medications Discontinued During This Encounter  Medication Reason  . atorvastatin (LIPITOR) 10 MG tablet       Time spent on visit including pre-visit chart review and post-visit care was 13 minutes.   Note:  This note was prepared with assistance of Dragon voice recognition software. Occasional wrong-word or sound-a-like substitutions may have occurred due to the inherent limitations of voice recognition software.   The [place] was signed into law in 2016  which includes the topic of electronic health records.  This provides immediate access to information in MyChart.  This includes consultation notes, operative notes, office notes, lab results and pathology reports.  If you have any questions about what you read please let us know at your next visit or call us at the office.  We are right here with you.   This document serves as a record of services personally performed by Julia Dance, DO. It was created on her behalf by Julia Clark, a trained medical scribe. The creation of this record is based on the scribe's personal observations and the provider's statements to them.   This case required medical decision making of at least moderate complexity. The above documentation from Julia Clark, medical scribe, has been reviewed by Julia Clark, D.O.     __________________________________________________________________________________     Patient Care Team    Relationship Specialty Notifications Start End  Julia Dance, DO PCP - General Family Medicine  10/16/19   Meredith Pel, MD Consulting  Physician Orthopedic Surgery  11/17/19    Comment: saw him for back pain/ osteoporotic spine fx     -Vitals obtained; medications/ allergies reconciled;  personal medical, social, Sx etc.histories were updated by CMA, reviewed by me and are reflected in chart   Patient Active Problem List   Diagnosis Date Noted  . Hypertension 11/17/2019  . Hyperlipidemia 11/17/2019  . Osteoporosis of multiple sites 11/17/2019  . Refuses treatment- with bisphosphonates or Prolia etc. 11/17/2019  . Vitamin D deficiency 11/17/2019  . Status post kyphoplasty 11/17/2019  . Osteoarthritis 11/17/2019  . Personal history of atrial fibrillation 11/17/2019  . Family history of leukemia 11/17/2019  . Family history of Alzheimer's disease 11/17/2019  . Family history of arthritis 11/17/2019  . Hx of cholecystectomy 11/17/2019  . History of  appendectomy 11/17/2019     Current Meds  Medication Sig  . Biotin w/ Vitamins C & E (HAIR/SKIN/NAILS) 1250-7.5-7.5 MCG-MG-UNT CHEW Chew by mouth.  . Calcium-Magnesium-Vitamin D (CALCIUM 1200+D3 PO) Take by mouth.  . Cholecalciferol (VITAMIN D3) 25 MCG (1000 UT) CAPS Take 1 capsule by mouth daily.  . Magnesium 100 MG CAPS Take by mouth.  . metoprolol succinate (TOPROL-XL) 50 MG 24 hr tablet TAKE 1 TABLET BY MOUTH EVERY DAY  . Multiple Vitamin (MULTIVITAMIN WITH MINERALS) TABS tablet Take 1 tablet by mouth daily.  . vitamin B-12 (CYANOCOBALAMIN) 100 MCG tablet Take 100 mcg by mouth daily.     Allergies:  Allergies  Allergen Reactions  . Ciprocin-Fluocin-Procin [Fluocinolone]   . Fosamax [Alendronate Sodium]   . Keflex [Cephalexin]      ROS:  See above HPI for pertinent positives and negatives   Objective:   Height 5' 3" (1.6 m), weight 130 lb (59 kg).  (if some vitals are omitted, this means that patient was UNABLE to obtain them even though they were asked to get them prior to OV today.  They were asked to call us at their earliest convenience with these once obtained. )  General: A & O * 3; sounds in no acute distress; in usual state of health.  Skin: Pt confirms warm and dry extremities and pink fingertips HEENT: Pt confirms lips non-cyanotic Chest: Patient confirms normal chest excursion and movement Respiratory: speaking in full sentences, no conversational dyspnea; patient confirms no use of accessory muscles Psych: insight appears good, mood- appears full

## 2020-03-11 ENCOUNTER — Other Ambulatory Visit: Payer: Self-pay | Admitting: Family Medicine

## 2020-03-12 DIAGNOSIS — Z20828 Contact with and (suspected) exposure to other viral communicable diseases: Secondary | ICD-10-CM | POA: Diagnosis not present

## 2020-03-15 ENCOUNTER — Telehealth: Payer: Self-pay

## 2020-03-15 NOTE — Telephone Encounter (Signed)
Opened in error. T. Shavonta Gossen, CMA 

## 2020-03-25 DIAGNOSIS — R32 Unspecified urinary incontinence: Secondary | ICD-10-CM | POA: Diagnosis not present

## 2020-03-25 DIAGNOSIS — Z881 Allergy status to other antibiotic agents status: Secondary | ICD-10-CM | POA: Diagnosis not present

## 2020-03-25 DIAGNOSIS — Z809 Family history of malignant neoplasm, unspecified: Secondary | ICD-10-CM | POA: Diagnosis not present

## 2020-03-25 DIAGNOSIS — M1990 Unspecified osteoarthritis, unspecified site: Secondary | ICD-10-CM | POA: Diagnosis not present

## 2020-03-25 DIAGNOSIS — G8929 Other chronic pain: Secondary | ICD-10-CM | POA: Diagnosis not present

## 2020-03-25 DIAGNOSIS — I10 Essential (primary) hypertension: Secondary | ICD-10-CM | POA: Diagnosis not present

## 2020-04-09 ENCOUNTER — Other Ambulatory Visit: Payer: Self-pay | Admitting: Family Medicine

## 2020-05-28 ENCOUNTER — Ambulatory Visit
Admission: EM | Admit: 2020-05-28 | Discharge: 2020-05-28 | Disposition: A | Discharge status: Home / Self Care | Payer: Medicare HMO | Attending: Emergency Medicine | Admitting: Emergency Medicine

## 2020-05-28 ENCOUNTER — Other Ambulatory Visit: Payer: Self-pay

## 2020-05-28 DIAGNOSIS — M79671 Pain in right foot: Secondary | ICD-10-CM

## 2020-05-28 HISTORY — DX: Age-related osteoporosis without current pathological fracture: M81.0

## 2020-05-28 HISTORY — DX: Other specified deforming dorsopathies, site unspecified: M43.8X9

## 2020-05-28 MED ORDER — NAPROXEN 500 MG PO TABS: 500.0000 mg | ORAL_TABLET | Freq: Two times a day (BID) | ORAL | 0 refills | Status: AC

## 2020-05-28 NOTE — ED Provider Notes (Signed)
[facility]    CSN: [id number] Arrival date & time: 05/28/20  1037      History   Chief Complaint Chief Complaint  Patient presents with  . Foot Pain    HPI Julia Clark is a 86 y.o. female with history of hypertension, osteoporosis presenting for right foot pain.  Patient states is been ongoing for the last 4 days.  States she recently changed a pair of shoes.  No change in activity level, trauma to area, change in medications.  No history of gout.  Patient denies known injury.  No warmth, fever, arthralgias, numbness.  Patient took ibuprofen yesterday with some relief.  Patient used to have a podiatrist in [place], though does not have one here: Requesting contact formation.   Past Medical History:  Diagnosis Date  . Acquired compression deformity of vertebra   . HTN (hypertension)   . Osteoporosis     Patient Active Problem List   Diagnosis Date Noted  . Osteoporosis of multiple sites 11/17/2019  . Refuses treatment- with bisphosphonates or Prolia etc. 11/17/2019  . Vitamin D deficiency 11/17/2019  . Hypertension 11/17/2019  . Osteoarthritis 11/17/2019  . Family history of leukemia 11/17/2019  . Family history of Alzheimer's disease 11/17/2019  . Family history of arthritis 11/17/2019  . Personal history of atrial fibrillation 11/17/2019  . Status post kyphoplasty 11/17/2019  . Hx of cholecystectomy 11/17/2019  . History of appendectomy 11/17/2019  . Hyperlipidemia 11/17/2019    Past Surgical History:  Procedure Laterality Date  . 3 finger joint replaced     . APPENDECTOMY  1944  . DG GALL BLADDER  1960  . KYPHOPLASTY      OB History   No obstetric history on file.      Home Medications    Prior to Admission medications   Medication Sig Start Date End Date Taking? Authorizing Provider  Biotin w/ Vitamins C & E (HAIR/SKIN/NAILS) 1250-7.5-7.5 MCG-MG-UNT CHEW Chew by mouth.   Yes [provider]  Calcium-Magnesium-Vitamin D  (CALCIUM 1200+D3 PO) Take by mouth.   Yes [provider]  Cholecalciferol (VITAMIN D3) 25 MCG (1000 UT) CAPS Take 1 capsule by mouth daily.   Yes [provider]  Magnesium 100 MG CAPS Take by mouth.   Yes [provider]  metoprolol succinate (TOPROL-XL) 50 MG 24 hr tablet TAKE 1 TABLET BY MOUTH EVERY DAY 04/09/20  Yes Abonza, Maritza, PA-C  Multiple Vitamin (MULTIVITAMIN WITH MINERALS) TABS tablet Take 1 tablet by mouth daily.   Yes [provider]  vitamin B-12 (CYANOCOBALAMIN) 100 MCG tablet Take 100 mcg by mouth daily.   Yes [provider]  naproxen (NAPROSYN) 500 MG tablet Take 1 tablet (500 mg total) by mouth 2 (two) times daily. 05/28/20   Hall-Potvin, [place], PA-C    Family History Family History  Problem Relation Age of Onset  . Cancer Father   . Alzheimer's disease Sister   . Arthritis Paternal Grandmother     Social History Social History   Tobacco Use  . Smoking status: Never Smoker  . Smokeless tobacco: Never Used  Substance Use Topics  . Alcohol use: Not Currently  . Drug use: Not Currently     Allergies   Ciprocin-fluocin-procin [fluocinolone], Fosamax [alendronate sodium], and Keflex [cephalexin]   Review of Systems As per HPI   Physical Exam Triage Vital Signs ED Triage Vitals  Enc Vitals Group     BP      Pulse  Resp      Temp      Temp src      SpO2      Weight      Height      Head Circumference      Peak Flow      Pain Score      Pain Loc      Pain Edu?      Excl. in [place]?    No data found.  Updated Vital Signs BP (!) 154/91 (BP Location: Left Arm)   Pulse 79   Temp 97.6 F (36.4 C) (Oral)   Resp 17   SpO2 96%   Visual Acuity Right Eye Distance:   Left Eye Distance:   Bilateral Distance:    Right Eye Near:   Left Eye Near:    Bilateral Near:     Physical Exam Constitutional:      General: She is not in acute distress. HENT:     Head: Normocephalic and atraumatic.    Eyes:     General: No scleral icterus.    Pupils: Pupils are equal, round, and reactive to light.  Cardiovascular:     Rate and Rhythm: Normal rate.  Pulmonary:     Effort: Pulmonary effort is normal.  Musculoskeletal:     Comments: Pain at ball of right foot.  No swelling, discoloration, change in sensation.  Pedal pulses intact.  Full active ROM.  Gait mildly antalgic, favoring right.  No bony deformity.  No heel tenderness.  Negative foot squeeze.  Skin:    Coloration: Skin is not jaundiced or pale.  Neurological:     Mental Status: She is alert and oriented to person, place, and time.      UC Treatments / Results  Labs (all labs ordered are listed, but only abnormal results are displayed) Labs Reviewed - No data to display  EKG   Radiology No results found.  Procedures Procedures (including critical care time)  Medications Ordered in UC Medications - No data to display  Initial Impression / Assessment and Plan / UC Course  I have reviewed the triage vital signs and the nursing notes.  Pertinent labs & imaging results that were available during my care of the patient were reviewed by me and considered in my medical decision making (see chart for details).     Patient appears well in office today.  Likely due to change in footwear.  Reviewed supportive care as outlined below including stretches, icing, medication.  Provided contact information for podiatry.  Return precautions discussed, patient verbalized understanding and is agreeable to plan. Final Clinical Impressions(s) / UC Diagnoses   Final diagnoses:  Right foot pain     Discharge Instructions     Take naproxen 2 times a day with food. Do foot stretches daily. Ice foot x 20 minutes, 3 times a day. Wear ace wrap. Follow up with podiatry.    ED Prescriptions    Medication Sig Dispense Auth. Provider   naproxen (NAPROSYN) 500 MG tablet Take 1 tablet (500 mg total) by mouth 2 (two) times daily. 30  tablet Hall-Potvin, [place], PA-C     PDMP not reviewed this encounter.   Hall-Potvin, [place], [place] 05/28/20 1134

## 2020-05-28 NOTE — Discharge Instructions (Addendum)
Take naproxen 2 times a day with food. Do foot stretches daily. Ice foot x 20 minutes, 3 times a day. Wear ace wrap. Follow up with podiatry.

## 2020-05-28 NOTE — ED Triage Notes (Signed)
Pt c/o acute right foot pain to ball of foot after wearing a "certain pair of shoes". Pt states pain increases with movement/weight bearing. C/o "warmth" to area. Foot slightly edematous-current shoe tight fitting; mild erythema, negative for wound evidence. Denies any known acute impact/injury. Neurovascular intact; +2 DP pulse  Took ibuprofen yesterday for pain relief.

## 2020-06-07 ENCOUNTER — Ambulatory Visit: Payer: Medicare HMO | Admitting: Podiatry

## 2020-06-08 ENCOUNTER — Ambulatory Visit (INDEPENDENT_AMBULATORY_CARE_PROVIDER_SITE_OTHER): Payer: Medicare HMO

## 2020-06-08 ENCOUNTER — Ambulatory Visit: Payer: Medicare HMO | Admitting: Podiatry

## 2020-06-08 ENCOUNTER — Other Ambulatory Visit: Payer: Self-pay

## 2020-06-08 ENCOUNTER — Encounter: Payer: Self-pay | Admitting: Podiatry

## 2020-06-08 DIAGNOSIS — M79671 Pain in right foot: Secondary | ICD-10-CM | POA: Diagnosis not present

## 2020-06-08 DIAGNOSIS — M10171 Lead-induced gout, right ankle and foot: Secondary | ICD-10-CM | POA: Diagnosis not present

## 2020-06-08 DIAGNOSIS — M7751 Other enthesopathy of right foot: Secondary | ICD-10-CM

## 2020-06-08 DIAGNOSIS — M10071 Idiopathic gout, right ankle and foot: Secondary | ICD-10-CM

## 2020-06-08 DIAGNOSIS — T560X1A Toxic effect of lead and its compounds, accidental (unintentional), initial encounter: Secondary | ICD-10-CM | POA: Diagnosis not present

## 2020-06-08 NOTE — Progress Notes (Signed)
   HPI: 86 y.o. female presenting today as a new patient with her husband for evaluation of pain that occurred approximately 10 days ago to the right great toe joint.  Patient denies injury and states that overnight the right great toe joint became severely painful with redness and swelling.  Patient cannot recall what would have elicited the pain.  She states that over the past 10 days it has improved and it feels better.  Initially she was unable to walk without pain.  She has been applying ice and lidocaine cream with improvement.  She presents for further treatment and evaluation  Past Medical History:  Diagnosis Date  . Acquired compression deformity of vertebra   . HTN (hypertension)   . Osteoporosis      Physical Exam: General: The patient is alert and oriented x3 in no acute distress.  Dermatology: Skin is warm, dry and supple bilateral lower extremities. Negative for open lesions or macerations.  Vascular: Palpable pedal pulses bilaterally. Capillary refill within normal limits.  Neurological: Epicritic and protective threshold grossly intact bilaterally.   Musculoskeletal Exam: Range of motion within normal limits to all pedal and ankle joints bilateral. Muscle strength 5/5 in all groups bilateral.  There is some very mild erythema around the right great toe joint with some minimal pain with range of motion noted.  Radiographic Exam:  Normal osseous mineralization. Joint spaces preserved. No fracture/dislocation/boney destruction.  Periarticular osteophytes noted to the medial aspect of the first MTPJ of the right foot based on AP view.  Findings consistent with chronic gouty tophi.  Assessment: 1.  Acute gout/capsulitis first MTPJ right foot-improved   Plan of Care:  1. Patient evaluated. X-Rays reviewed.  2.  Today explained that the patient may have had an episode of gout.  Fortunately she has noticed significant improvement since the onset. 3.  Recommend good supportive  shoes and getting educated on diet and what foods to refrain from in order to prevent gout flareups 4.  Return to clinic as needed      Edrick Kins, DPM [facility]  Dr. Edrick Kins, DPM    [address], [place] [address]                Office [phone]  Fax [phone]

## 2020-06-09 ENCOUNTER — Other Ambulatory Visit: Payer: Self-pay | Admitting: Podiatry

## 2020-06-09 DIAGNOSIS — M10171 Lead-induced gout, right ankle and foot: Secondary | ICD-10-CM

## 2020-06-09 DIAGNOSIS — T560X1A Toxic effect of lead and its compounds, accidental (unintentional), initial encounter: Secondary | ICD-10-CM

## 2020-07-07 ENCOUNTER — Other Ambulatory Visit: Payer: Self-pay | Admitting: Physician Assistant

## 2020-08-03 DIAGNOSIS — R69 Illness, unspecified: Secondary | ICD-10-CM | POA: Diagnosis not present

## 2020-08-24 ENCOUNTER — Ambulatory Visit (INDEPENDENT_AMBULATORY_CARE_PROVIDER_SITE_OTHER): Payer: Medicare HMO | Admitting: Physician Assistant

## 2020-08-24 ENCOUNTER — Other Ambulatory Visit: Payer: Self-pay

## 2020-08-24 ENCOUNTER — Encounter: Payer: Self-pay | Admitting: Physician Assistant

## 2020-08-24 VITALS — BP 137/64 | HR 85 | Temp 96.1°F | Ht 63.0 in | Wt 130.0 lb

## 2020-08-24 DIAGNOSIS — Z0000 Encounter for general adult medical examination without abnormal findings: Secondary | ICD-10-CM

## 2020-08-24 NOTE — Progress Notes (Signed)
Virtual Visit via Telephone Note:  I connected with Julia Clark by telephone and verified that I am speaking with the correct person using two identifiers.    I discussed the limitations, risks, security and privacy concerns for performing an evaluation and management service by telephone and the availability of in person appointments. The staff discussed with patient that there may be a patient responsible charge related to this service. The patient expressed understanding and agreed to proceed.   Location of Patient- Home Location of Provider- Office   Subjective:   Julia Clark is a 86 y.o. female who presents for Medicare Annual (Subsequent) preventive examination.  Review of Systems     General:   No F/C, wt loss Pulm:   No DIB, SOB, pleuritic chest pain Card:  No CP, palpitations Abd:  No n/v/d or pain Ext:  No inc edema from baseline  Objective:    Today's Vitals   08/24/20 1401  BP: 137/64  Pulse: 85  Temp: (!) 96.1 F (35.6 C)  Weight: 130 lb (59 kg)  Height: 5\' 3"  (1.6 m)   Body mass index is 23.03 kg/m.  No flowsheet data found.  Current Medications (verified) Outpatient Encounter Medications as of 08/24/2020  Medication Sig  . Biotin w/ Vitamins C & E (HAIR/SKIN/NAILS) 1250-7.5-7.5 MCG-MG-UNT CHEW Chew by mouth.  . Calcium-Magnesium-Vitamin D (CALCIUM 1200+D3 PO) Take by mouth.  . Cholecalciferol (VITAMIN D3) 25 MCG (1000 UT) CAPS Take 1 capsule by mouth daily.  . Magnesium 100 MG CAPS Take by mouth.  . metoprolol succinate (TOPROL-XL) 50 MG 24 hr tablet TAKE 1 TABLET BY MOUTH EVERY DAY  . Multiple Vitamin (MULTIVITAMIN WITH MINERALS) TABS tablet Take 1 tablet by mouth daily.  . vitamin B-12 (CYANOCOBALAMIN) 100 MCG tablet Take 100 mcg by mouth daily.  . naproxen (NAPROSYN) 500 MG tablet Take 1 tablet (500 mg total) by mouth 2 (two) times daily. (Patient not taking: Reported on 08/24/2020)   No facility-administered encounter medications on file as of  08/24/2020.    Allergies (verified) Ciprocin-fluocin-procin [fluocinolone], Fosamax [alendronate sodium], and Keflex [cephalexin]   History: Past Medical History:  Diagnosis Date  . Acquired compression deformity of vertebra   . HTN (hypertension)   . Osteoporosis    Past Surgical History:  Procedure Laterality Date  . 3 finger joint replaced     . APPENDECTOMY  1944  . DG GALL BLADDER  1960  . KYPHOPLASTY     Family History  Problem Relation Age of Onset  . Cancer Father   . Alzheimer's disease Sister   . Arthritis Paternal Grandmother    Social History   Socioeconomic History  . Marital status: Married    Spouse name: Not on file  . Number of children: Not on file  . Years of education: Not on file  . Highest education level: Not on file  Occupational History  . Not on file  Tobacco Use  . Smoking status: Never Smoker  . Smokeless tobacco: Never Used  Substance and Sexual Activity  . Alcohol use: Not Currently  . Drug use: Not Currently  . Sexual activity: Not Currently  Other Topics Concern  . Not on file  Social History Narrative  . Not on file   Social Determinants of Health   Financial Resource Strain:   . Difficulty of Paying Living Expenses: Not on file  Food Insecurity:   . Worried About Charity fundraiser in the Last Year: Not on file  .  Ran Out of Food in the Last Year: Not on file  Transportation Needs:   . Lack of Transportation (Medical): Not on file  . Lack of Transportation (Non-Medical): Not on file  Physical Activity:   . Days of Exercise per Week: Not on file  . Minutes of Exercise per Session: Not on file  Stress:   . Feeling of Stress : Not on file  Social Connections:   . Frequency of Communication with Friends and Family: Not on file  . Frequency of Social Gatherings with Friends and Family: Not on file  . Attends Religious Services: Not on file  . Active Member of Clubs or Organizations: Not on file  . Attends Theatre manager Meetings: Not on file  . Marital Status: Not on file    Tobacco Counseling Counseling given: Not Answered   Diabetic?NO    Activities of Daily Living In your present state of health, do you have any difficulty performing the following activities: 08/24/2020 11/17/2019  Hearing? Tempie Donning  Vision? Y Y  Difficulty concentrating or making decisions? Tempie Donning  Walking or climbing stairs? Y Y  Dressing or bathing? N N  Doing errands, shopping? Tempie Donning  Some recent data might be hidden    Patient Care Team: Lorrene Reid, PA-C as PCP - General Marlou Sa [place]onna [place], MD as Consulting Physician (Orthopedic Surgery)  Indicate any recent Medical Services you may have received from other than [facility] providers in the past year (date may be approximate).     Assessment:   This is a routine wellness examination for Julia Clark.  Hearing/Vision screen No exam data present  Dietary issues and exercise activities discussed:  -Follow a heart healthy diet and reduce butter/red meat, and resume walking regimen.   Goals   None    Depression Screen PHQ 2/9 Scores 08/24/2020 12/23/2019 11/17/2019  PHQ - 2 Score 0 0 0  PHQ- 9 Score 1 1 0    Fall Risk Fall Risk  08/24/2020 11/17/2019  Falls in the past year? 0 0  Number falls in past yr: - 0  Injury with Fall? - 0  Follow up Falls evaluation completed Falls evaluation completed    Any stairs in or around the home? Yes  If so, are there any without handrails? Yes  Home free of loose throw rugs in walkways, pet beds, electrical cords, etc? Yes  Adequate lighting in your home to reduce risk of falls? Yes   ASSISTIVE DEVICES UTILIZED TO PREVENT FALLS:  Life alert? No  Use of a cane, walker or w/c? No  Grab bars in the bathroom? Yes  Shower chair or bench in shower? Yes  Elevated toilet seat or a handicapped toilet? Yes   TIMED UP AND GO:  Was the test performed? No .  Length of time to ambulate 10 feet: NOT PERFORMED sec.     Cognitive  Function: wnl     6CIT Screen 08/24/2020  What Year? 0 points  What month? 0 points  What time? 0 points  Count back from 20 0 points  Months in reverse 0 points  Repeat phrase 0 points  Total Score 0    Immunizations  There is no immunization history on file for this patient.  TDAP status: Due, Education has been provided regarding the importance of this vaccine. Advised may receive this vaccine at local pharmacy or Health Dept. Aware to provide a copy of the vaccination record if obtained from local pharmacy or Health Dept. Verbalized  acceptance and understanding. THINKS MAY HAVE BEEN DONE IN [place] Flu Vaccine status: Declined, Education has been provided regarding the importance of this vaccine but patient still declined. Advised may receive this vaccine at local pharmacy or Health Dept. Aware to provide a copy of the vaccination record if obtained from local pharmacy or Health Dept. Verbalized acceptance and understanding. Pneumococcal vaccine status: Up to date DONE IN [place]  Covid-19 vaccine status: Declined, Education has been provided regarding the importance of this vaccine but patient still declined. Advised may receive this vaccine at local pharmacy or Health Dept.or vaccine clinic. Aware to provide a copy of the vaccination record if obtained from local pharmacy or Health Dept. Verbalized acceptance and understanding.  Qualifies for Shingles Vaccine? Yes  DONE IN [place] Zostavax completed Yes   Shingrix Completed?: Yes  Screening Tests Health Maintenance  Topic Date Due  . COVID-19 Vaccine (1) Never done  . TETANUS/TDAP  Never done  . PNA vac Low Risk Adult (1 of 2 - PCV13) Never done  . INFLUENZA VACCINE  Never done  . DEXA SCAN  Completed    Health Maintenance  Health Maintenance Due  Topic Date Due  . COVID-19 Vaccine (1) Never done  . TETANUS/TDAP  Never done  . PNA vac Low Risk Adult (1 of 2 - PCV13) Never done  . INFLUENZA VACCINE  Never done     Colorectal cancer screening: No longer required.  Mammogram status: No longer required.  Bone Density status: Completed 2019. Results reflect: Bone density results: OSTEOPOROSIS. Repeat every 2 years. Pt is followed by Ortho- Dr. Marlou Sa, previously declined osteoporosis treatment  Lung Cancer Screening: (Low Dose CT Chest recommended if Age 3-80 years, 30 pack-year currently smoking OR have quit w/in 15years.) does not qualify.   Lung Cancer Screening Referral:   Additional Screening:  Hepatitis C Screening: does qualify; PATIENT DECLINED  Vision Screening: Recommended annual ophthalmology exams for early detection of glaucoma and other disorders of the eye. Is the patient up to date with their annual eye exam?  No  Who is the provider or what is the name of the office in which the patient attends annual eye exams?  If pt is not established with a provider, would they like to be referred to a provider to establish care? No .   Dental Screening: Recommended annual dental exams for proper oral hygiene  Community Resource Referral / Chronic Care Management: CRR required this visit?  No   CCM required this visit?  No      Plan:  -Continue current medication regimen. -Stay well hydrated. -Follow up in 4 months for reg OV: HTN, HLD, and recheck lipid panel, cmp  I have personally reviewed and noted the following in the patient's chart:   . Medical and social history . Use of alcohol, tobacco or illicit drugs  . Current medications and supplements . Functional ability and status . Nutritional status . Physical activity . Advanced directives . List of other physicians . Hospitalizations, surgeries, and ER visits in previous 12 months . Vitals . Screenings to include cognitive, depression, and falls . Referrals and appointments  In addition, I have reviewed and discussed with patient certain preventive protocols, quality metrics, and best practice recommendations. A written  personalized care plan for preventive services as well as general preventive health recommendations were provided to patient.     Lorrene Reid, PA-C   08/24/2020

## 2020-09-07 DIAGNOSIS — R69 Illness, unspecified: Secondary | ICD-10-CM | POA: Diagnosis not present

## 2020-10-06 ENCOUNTER — Other Ambulatory Visit: Payer: Self-pay | Admitting: Physician Assistant

## 2021-01-20 DIAGNOSIS — H52223 Regular astigmatism, bilateral: Secondary | ICD-10-CM | POA: Diagnosis not present

## 2021-02-03 DIAGNOSIS — H5034 Intermittent alternating exotropia: Secondary | ICD-10-CM | POA: Diagnosis not present

## 2021-02-03 DIAGNOSIS — H52223 Regular astigmatism, bilateral: Secondary | ICD-10-CM | POA: Diagnosis not present

## 2021-02-03 DIAGNOSIS — H5021 Vertical strabismus, right eye: Secondary | ICD-10-CM | POA: Diagnosis not present

## 2021-02-04 DIAGNOSIS — H524 Presbyopia: Secondary | ICD-10-CM | POA: Diagnosis not present

## 2021-02-04 DIAGNOSIS — H52223 Regular astigmatism, bilateral: Secondary | ICD-10-CM | POA: Diagnosis not present

## 2021-02-10 ENCOUNTER — Telehealth: Payer: Self-pay | Admitting: Physician Assistant

## 2021-02-10 ENCOUNTER — Other Ambulatory Visit: Payer: Self-pay | Admitting: Physician Assistant

## 2021-02-10 NOTE — Telephone Encounter (Signed)
Left voicemail letting patient know about getting on the schedule per last AVS for medication refills.

## 2021-02-10 NOTE — Telephone Encounter (Signed)
Please contact pt to schedule apt per last AVS for further medication refills. AS, CMA

## 2021-03-04 DIAGNOSIS — L570 Actinic keratosis: Secondary | ICD-10-CM | POA: Diagnosis not present

## 2021-03-04 DIAGNOSIS — C44311 Basal cell carcinoma of skin of nose: Secondary | ICD-10-CM | POA: Diagnosis not present

## 2021-03-04 DIAGNOSIS — X32XXXA Exposure to sunlight, initial encounter: Secondary | ICD-10-CM | POA: Diagnosis not present

## 2021-04-16 ENCOUNTER — Other Ambulatory Visit: Payer: Self-pay | Admitting: Physician Assistant

## 2021-05-03 DIAGNOSIS — I10 Essential (primary) hypertension: Secondary | ICD-10-CM | POA: Diagnosis not present

## 2021-05-03 DIAGNOSIS — Z9889 Other specified postprocedural states: Secondary | ICD-10-CM | POA: Diagnosis not present

## 2021-05-06 DIAGNOSIS — C44311 Basal cell carcinoma of skin of nose: Secondary | ICD-10-CM | POA: Diagnosis not present

## 2021-05-11 ENCOUNTER — Ambulatory Visit: Payer: Medicare HMO | Admitting: Physician Assistant

## 2021-05-11 DIAGNOSIS — Z0000 Encounter for general adult medical examination without abnormal findings: Secondary | ICD-10-CM | POA: Diagnosis not present

## 2021-05-11 DIAGNOSIS — M8008XA Age-related osteoporosis with current pathological fracture, vertebra(e), initial encounter for fracture: Secondary | ICD-10-CM | POA: Diagnosis not present

## 2021-06-03 DIAGNOSIS — Z08 Encounter for follow-up examination after completed treatment for malignant neoplasm: Secondary | ICD-10-CM | POA: Diagnosis not present

## 2021-06-03 DIAGNOSIS — Z85828 Personal history of other malignant neoplasm of skin: Secondary | ICD-10-CM | POA: Diagnosis not present

## 2021-06-16 ENCOUNTER — Other Ambulatory Visit: Payer: Self-pay | Admitting: Physician Assistant

## 2021-06-16 NOTE — Telephone Encounter (Signed)
Ben,   Please contact the patient to schedule a follow up per last AVS.   Respectfully,  Gwyndolyn Saxon

## 2021-06-18 ENCOUNTER — Other Ambulatory Visit: Payer: Self-pay | Admitting: Physician Assistant

## 2021-06-28 DIAGNOSIS — M1990 Unspecified osteoarthritis, unspecified site: Secondary | ICD-10-CM | POA: Diagnosis not present

## 2021-06-28 DIAGNOSIS — Z85828 Personal history of other malignant neoplasm of skin: Secondary | ICD-10-CM | POA: Diagnosis not present

## 2021-06-28 DIAGNOSIS — R32 Unspecified urinary incontinence: Secondary | ICD-10-CM | POA: Diagnosis not present

## 2021-06-28 DIAGNOSIS — I10 Essential (primary) hypertension: Secondary | ICD-10-CM | POA: Diagnosis not present

## 2021-06-28 DIAGNOSIS — Z881 Allergy status to other antibiotic agents status: Secondary | ICD-10-CM | POA: Diagnosis not present

## 2021-06-28 DIAGNOSIS — Z20822 Contact with and (suspected) exposure to covid-19: Secondary | ICD-10-CM | POA: Diagnosis not present

## 2021-09-12 ENCOUNTER — Ambulatory Visit
Admission: RE | Admit: 2021-09-12 | Discharge: 2021-09-12 | Disposition: A | Discharge status: Home / Self Care | Payer: Self-pay | Source: Ambulatory Visit | Attending: *Deleted | Admitting: *Deleted

## 2021-09-12 ENCOUNTER — Other Ambulatory Visit: Payer: Self-pay | Admitting: *Deleted

## 2021-09-12 DIAGNOSIS — M25551 Pain in right hip: Secondary | ICD-10-CM

## 2021-09-15 DIAGNOSIS — R300 Dysuria: Secondary | ICD-10-CM | POA: Diagnosis not present

## 2021-09-20 ENCOUNTER — Other Ambulatory Visit: Payer: Self-pay | Admitting: *Deleted

## 2021-09-20 ENCOUNTER — Ambulatory Visit
Admission: RE | Admit: 2021-09-20 | Discharge: 2021-09-20 | Disposition: A | Discharge status: Home / Self Care | Payer: Medicare HMO | Source: Ambulatory Visit | Attending: *Deleted | Admitting: *Deleted

## 2021-09-20 ENCOUNTER — Other Ambulatory Visit: Payer: Self-pay

## 2021-09-20 DIAGNOSIS — R52 Pain, unspecified: Secondary | ICD-10-CM

## 2021-09-20 DIAGNOSIS — M545 Low back pain, unspecified: Secondary | ICD-10-CM | POA: Diagnosis not present

## 2021-09-20 DIAGNOSIS — M25551 Pain in right hip: Secondary | ICD-10-CM

## 2021-09-27 ENCOUNTER — Emergency Department (HOSPITAL_COMMUNITY): Payer: Medicare HMO

## 2021-09-27 ENCOUNTER — Encounter (HOSPITAL_COMMUNITY): Payer: Self-pay | Admitting: Neurology

## 2021-09-27 ENCOUNTER — Inpatient Hospital Stay (HOSPITAL_COMMUNITY): Payer: Medicare HMO

## 2021-09-27 ENCOUNTER — Inpatient Hospital Stay (HOSPITAL_COMMUNITY)
Admission: EM | Admit: 2021-09-27 | Discharge: 2021-10-11 | DRG: 064 | Disposition: E | Payer: Medicare HMO | Attending: Neurology | Admitting: Neurology

## 2021-09-27 ENCOUNTER — Other Ambulatory Visit: Payer: Self-pay

## 2021-09-27 DIAGNOSIS — R4020 Unspecified coma: Secondary | ICD-10-CM | POA: Diagnosis present

## 2021-09-27 DIAGNOSIS — R001 Bradycardia, unspecified: Secondary | ICD-10-CM | POA: Diagnosis present

## 2021-09-27 DIAGNOSIS — M1990 Unspecified osteoarthritis, unspecified site: Secondary | ICD-10-CM | POA: Diagnosis present

## 2021-09-27 DIAGNOSIS — G936 Cerebral edema: Secondary | ICD-10-CM | POA: Diagnosis present

## 2021-09-27 DIAGNOSIS — J9601 Acute respiratory failure with hypoxia: Secondary | ICD-10-CM | POA: Diagnosis not present

## 2021-09-27 DIAGNOSIS — I10 Essential (primary) hypertension: Secondary | ICD-10-CM | POA: Diagnosis present

## 2021-09-27 DIAGNOSIS — R404 Transient alteration of awareness: Secondary | ICD-10-CM | POA: Diagnosis not present

## 2021-09-27 DIAGNOSIS — G9341 Metabolic encephalopathy: Secondary | ICD-10-CM | POA: Diagnosis not present

## 2021-09-27 DIAGNOSIS — M810 Age-related osteoporosis without current pathological fracture: Secondary | ICD-10-CM | POA: Diagnosis present

## 2021-09-27 DIAGNOSIS — G8191 Hemiplegia, unspecified affecting right dominant side: Secondary | ICD-10-CM | POA: Diagnosis present

## 2021-09-27 DIAGNOSIS — G9340 Encephalopathy, unspecified: Secondary | ICD-10-CM | POA: Diagnosis not present

## 2021-09-27 DIAGNOSIS — E785 Hyperlipidemia, unspecified: Secondary | ICD-10-CM | POA: Diagnosis present

## 2021-09-27 DIAGNOSIS — Z4682 Encounter for fitting and adjustment of non-vascular catheter: Secondary | ICD-10-CM | POA: Diagnosis not present

## 2021-09-27 DIAGNOSIS — Z881 Allergy status to other antibiotic agents status: Secondary | ICD-10-CM | POA: Diagnosis not present

## 2021-09-27 DIAGNOSIS — R220 Localized swelling, mass and lump, head: Secondary | ICD-10-CM | POA: Diagnosis not present

## 2021-09-27 DIAGNOSIS — Z20822 Contact with and (suspected) exposure to covid-19: Secondary | ICD-10-CM | POA: Diagnosis present

## 2021-09-27 DIAGNOSIS — R29728 NIHSS score 28: Secondary | ICD-10-CM | POA: Diagnosis present

## 2021-09-27 DIAGNOSIS — I161 Hypertensive emergency: Secondary | ICD-10-CM | POA: Diagnosis present

## 2021-09-27 DIAGNOSIS — Z79899 Other long term (current) drug therapy: Secondary | ICD-10-CM

## 2021-09-27 DIAGNOSIS — R0902 Hypoxemia: Secondary | ICD-10-CM | POA: Diagnosis not present

## 2021-09-27 DIAGNOSIS — Z66 Do not resuscitate: Secondary | ICD-10-CM | POA: Diagnosis present

## 2021-09-27 DIAGNOSIS — Z515 Encounter for palliative care: Secondary | ICD-10-CM | POA: Diagnosis not present

## 2021-09-27 DIAGNOSIS — I611 Nontraumatic intracerebral hemorrhage in hemisphere, cortical: Secondary | ICD-10-CM | POA: Diagnosis not present

## 2021-09-27 DIAGNOSIS — Z888 Allergy status to other drugs, medicaments and biological substances status: Secondary | ICD-10-CM

## 2021-09-27 DIAGNOSIS — I619 Nontraumatic intracerebral hemorrhage, unspecified: Secondary | ICD-10-CM

## 2021-09-27 DIAGNOSIS — I639 Cerebral infarction, unspecified: Secondary | ICD-10-CM | POA: Diagnosis not present

## 2021-09-27 DIAGNOSIS — Z820 Family history of epilepsy and other diseases of the nervous system: Secondary | ICD-10-CM | POA: Diagnosis not present

## 2021-09-27 DIAGNOSIS — G935 Compression of brain: Secondary | ICD-10-CM | POA: Diagnosis present

## 2021-09-27 DIAGNOSIS — Z809 Family history of malignant neoplasm, unspecified: Secondary | ICD-10-CM

## 2021-09-27 DIAGNOSIS — R29735 NIHSS score 35: Secondary | ICD-10-CM

## 2021-09-27 DIAGNOSIS — R519 Headache, unspecified: Secondary | ICD-10-CM | POA: Diagnosis not present

## 2021-09-27 DIAGNOSIS — Z743 Need for continuous supervision: Secondary | ICD-10-CM | POA: Diagnosis not present

## 2021-09-27 LAB — TYPE AND SCREEN
ABO/RH(D): O POS
Antibody Screen: NEGATIVE

## 2021-09-27 LAB — DIFFERENTIAL
Abs Immature Granulocytes: 0.03 10*3/uL (ref 0.00–0.07)
Basophils Absolute: 0 10*3/uL (ref 0.0–0.1)
Basophils Relative: 0 %
Eosinophils Absolute: 0 10*3/uL (ref 0.0–0.5)
Eosinophils Relative: 1 %
Immature Granulocytes: 0 %
Lymphocytes Relative: 17 %
Lymphs Abs: 1.3 10*3/uL (ref 0.7–4.0)
Monocytes Absolute: 0.6 10*3/uL (ref 0.1–1.0)
Monocytes Relative: 7 %
Neutro Abs: 6.1 10*3/uL (ref 1.7–7.7)
Neutrophils Relative %: 75 %

## 2021-09-27 LAB — COMPREHENSIVE METABOLIC PANEL
ALT: 11 U/L (ref 0–44)
AST: 24 U/L (ref 15–41)
Albumin: 3.6 g/dL (ref 3.5–5.0)
Alkaline Phosphatase: 58 U/L (ref 38–126)
Anion gap: 7 (ref 5–15)
BUN: 24 mg/dL — ABNORMAL HIGH (ref 8–23)
CO2: 24 mmol/L (ref 22–32)
Calcium: 8.9 mg/dL (ref 8.9–10.3)
Chloride: 104 mmol/L (ref 98–111)
Creatinine, Ser: 0.86 mg/dL (ref 0.44–1.00)
GFR, Estimated: >60 mL/min (ref >60)
Glucose, Bld: 134 mg/dL — ABNORMAL HIGH (ref 70–99)
Potassium: 3.8 mmol/L (ref 3.5–5.1)
Sodium: 135 mmol/L (ref 135–145)
Total Bilirubin: 0.7 mg/dL (ref 0.3–1.2)
Total Protein: 6.2 g/dL — ABNORMAL LOW (ref 6.5–8.1)

## 2021-09-27 LAB — CBC
HCT: 34.2 % — ABNORMAL LOW (ref 36.0–46.0)
Hemoglobin: 11.1 g/dL — ABNORMAL LOW (ref 12.0–15.0)
MCH: 31.3 pg (ref 26.0–34.0)
MCHC: 32.5 g/dL (ref 30.0–36.0)
MCV: 96.3 fL (ref 80.0–100.0)
Platelets: 198 10*3/uL (ref 150–400)
RBC: 3.55 MIL/uL — ABNORMAL LOW (ref 3.87–5.11)
RDW: 12.6 % (ref 11.5–15.5)
WBC: 8.1 10*3/uL (ref 4.0–10.5)
nRBC: 0 % (ref 0.0–0.2)

## 2021-09-27 LAB — I-STAT ARTERIAL BLOOD GAS, ED
Acid-Base Excess: 4 mmol/L — ABNORMAL HIGH (ref 0.0–2.0)
Bicarbonate: 27.6 mmol/L (ref 20.0–28.0)
Calcium, Ion: 1.15 mmol/L (ref 1.15–1.40)
HCT: 33 % — ABNORMAL LOW (ref 36.0–46.0)
Hemoglobin: 11.2 g/dL — ABNORMAL LOW (ref 12.0–15.0)
O2 Saturation: 100 %
Patient temperature: 97.2
Potassium: 3.6 mmol/L (ref 3.5–5.1)
Sodium: 139 mmol/L (ref 135–145)
TCO2: 29 mmol/L (ref 22–32)
pCO2 arterial: 35.9 mmHg (ref 32.0–48.0)
pH, Arterial: 7.49 — ABNORMAL HIGH (ref 7.350–7.450)
pO2, Arterial: 413 mmHg — ABNORMAL HIGH (ref 83.0–108.0)

## 2021-09-27 LAB — I-STAT CHEM 8, ED
BUN: 26 mg/dL — ABNORMAL HIGH (ref 8–23)
Calcium, Ion: 0.97 mmol/L — ABNORMAL LOW (ref 1.15–1.40)
Chloride: 103 mmol/L (ref 98–111)
Creatinine, Ser: 0.8 mg/dL (ref 0.44–1.00)
Glucose, Bld: 139 mg/dL — ABNORMAL HIGH (ref 70–99)
HCT: 34 % — ABNORMAL LOW (ref 36.0–46.0)
Hemoglobin: 11.6 g/dL — ABNORMAL LOW (ref 12.0–15.0)
Potassium: 3.6 mmol/L (ref 3.5–5.1)
Sodium: 135 mmol/L (ref 135–145)
TCO2: 24 mmol/L (ref 22–32)

## 2021-09-27 LAB — APTT: aPTT: 30 seconds (ref 24–36)

## 2021-09-27 LAB — PROTIME-INR
INR: 1.1 (ref 0.8–1.2)
Prothrombin Time: 14.1 seconds (ref 11.4–15.2)

## 2021-09-27 LAB — RESP PANEL BY RT-PCR (FLU A&B, COVID) ARPGX2
Influenza A by PCR: NEGATIVE
Influenza B by PCR: NEGATIVE
SARS Coronavirus 2 by RT PCR: NEGATIVE

## 2021-09-27 LAB — GLUCOSE, CAPILLARY: Glucose-Capillary: 115 mg/dL — ABNORMAL HIGH (ref 70–99)

## 2021-09-27 LAB — ETHANOL: Alcohol, Ethyl (B): <10 mg/dL (ref <10)

## 2021-09-27 LAB — MAGNESIUM: Magnesium: 2 mg/dL (ref 1.7–2.4)

## 2021-09-27 LAB — PHOSPHORUS: Phosphorus: 3.4 mg/dL (ref 2.5–4.6)

## 2021-09-27 MED ORDER — ACETAMINOPHEN 160 MG/5ML PO SOLN
650.0000 mg | ORAL | Status: DC | PRN
  Filled 2021-09-27: qty 20.3

## 2021-09-27 MED ORDER — PROPOFOL 1000 MG/100ML IV EMUL
5.0000 ug/kg/min | INTRAVENOUS | Status: DC
  Administered 2021-09-27 – 2021-09-28 (×2): 20 ug/kg/min via INTRAVENOUS
  Filled 2021-09-27 (×2): qty 100

## 2021-09-27 MED ORDER — LABETALOL HCL 5 MG/ML IV SOLN: 20.0000 mg | Freq: Once | INTRAVENOUS | Status: DC

## 2021-09-27 MED ORDER — STROKE: EARLY STAGES OF RECOVERY BOOK
Freq: Once | Status: AC
  Filled 2021-09-27: qty 1

## 2021-09-27 MED ORDER — FENTANYL CITRATE PF 50 MCG/ML IJ SOSY: 50.0000 ug | PREFILLED_SYRINGE | Freq: Once | INTRAMUSCULAR | Status: DC

## 2021-09-27 MED ORDER — PANTOPRAZOLE SODIUM 40 MG IV SOLR
40.0000 mg | Freq: Every day | INTRAVENOUS | Status: DC
  Administered 2021-09-28: 40 mg via INTRAVENOUS
  Filled 2021-09-27: qty 40

## 2021-09-27 MED ORDER — LEVETIRACETAM IN NACL 500 MG/100ML IV SOLN
500.0000 mg | Freq: Two times a day (BID) | INTRAVENOUS | Status: DC
  Administered 2021-09-28: 500 mg via INTRAVENOUS
  Filled 2021-09-27 (×2): qty 100

## 2021-09-27 MED ORDER — SENNOSIDES-DOCUSATE SODIUM 8.6-50 MG PO TABS: 1.0000 | ORAL_TABLET | Freq: Two times a day (BID) | ORAL | Status: DC

## 2021-09-27 MED ORDER — FENTANYL BOLUS VIA INFUSION
25.0000 ug | INTRAVENOUS | Status: DC | PRN
Start: 2021-09-27 — End: 2021-09-28
  Filled 2021-09-27: qty 25

## 2021-09-27 MED ORDER — CLEVIDIPINE BUTYRATE 0.5 MG/ML IV EMUL
0.0000 mg/h | INTRAVENOUS | Status: DC
  Administered 2021-09-27: 2 mg/h via INTRAVENOUS
  Filled 2021-09-27: qty 50

## 2021-09-27 MED ORDER — ACETAMINOPHEN 650 MG RE SUPP: 650.0000 mg | RECTAL | Status: DC | PRN

## 2021-09-27 MED ORDER — SODIUM CHLORIDE 3 % IV BOLUS
250.0000 mL | Freq: Once | INTRAVENOUS | Status: AC
  Administered 2021-09-27: 250 mL via INTRAVENOUS
  Filled 2021-09-27: qty 500

## 2021-09-27 MED ORDER — FENTANYL CITRATE PF 50 MCG/ML IJ SOSY
PREFILLED_SYRINGE | INTRAMUSCULAR | Status: AC
  Filled 2021-09-27: qty 1

## 2021-09-27 MED ORDER — SODIUM CHLORIDE 0.9 % IV BOLUS: 500.0000 mL | Freq: Once | INTRAVENOUS | Status: DC

## 2021-09-27 MED ORDER — PROPOFOL 1000 MG/100ML IV EMUL: 0.0000 ug/kg/min | INTRAVENOUS | Status: DC

## 2021-09-27 MED ORDER — FENTANYL CITRATE PF 50 MCG/ML IJ SOSY
PREFILLED_SYRINGE | INTRAMUSCULAR | Status: AC | PRN
  Administered 2021-09-27: 50 ug via INTRAVENOUS

## 2021-09-27 MED ORDER — SODIUM CHLORIDE 0.9 % IV SOLN: 100.0000 mL/h | INTRAVENOUS | Status: DC

## 2021-09-27 MED ORDER — FENTANYL 2500MCG IN NS 250ML (10MCG/ML) PREMIX INFUSION
25.0000 ug/h | INTRAVENOUS | Status: DC
  Administered 2021-09-27: 50 ug/h via INTRAVENOUS
  Filled 2021-09-27: qty 250

## 2021-09-27 MED ORDER — ACETAMINOPHEN 325 MG PO TABS: 650.0000 mg | ORAL_TABLET | ORAL | Status: DC | PRN

## 2021-09-27 MED ORDER — PROPOFOL 1000 MG/100ML IV EMUL
5.0000 ug/kg/min | INTRAVENOUS | Status: DC
  Administered 2021-09-27: 20 ug/kg/min via INTRAVENOUS

## 2021-09-27 MED ORDER — SUCCINYLCHOLINE CHLORIDE 200 MG/10ML IV SOSY
PREFILLED_SYRINGE | INTRAVENOUS | Status: AC | PRN
  Administered 2021-09-27: 140 mg via INTRAVENOUS

## 2021-09-27 MED ORDER — SODIUM CHLORIDE 3 % IV SOLN
INTRAVENOUS | Status: DC
  Filled 2021-09-27 (×2): qty 500

## 2021-09-27 MED ORDER — LEVETIRACETAM IN NACL 1000 MG/100ML IV SOLN
1000.0000 mg | Freq: Once | INTRAVENOUS | Status: AC
  Administered 2021-09-27: 1000 mg via INTRAVENOUS
  Filled 2021-09-27: qty 100

## 2021-09-27 MED ORDER — ETOMIDATE 2 MG/ML IV SOLN
INTRAVENOUS | Status: AC | PRN
  Administered 2021-09-27: 20 mg via INTRAVENOUS

## 2021-09-27 NOTE — H&P (Signed)
Neurology H&P  Mikayla Chiusano MR# [id number] 09/11/2021  CC: unresponsive right weakness  History is obtained from: EMS and chart.  HPI: Anis Cinelli is a 87 y.o. female PMHx as reviewed below was with family today and complained of left sided headache and went unresponsive. Family began CPR and patient woke with right sided weakness. EMS noted patient was alert en route however vomited and became unresponsive.  LKW: 1800 tNK given: No hemorrhage IR Thrombectomy No Modified Rankin Scale: 0-Completely asymptomatic and back to baseline post- stroke NIHSS: 35 LOC Responsiveness 2 LOC Questions 2 LOC Commands 2 Horizontal eye movement 2 Visual field 3 Facial palsy 1 Motor arm - Right arm 4 Motor arm - Left arm 3 Motor leg - Right leg 4 Motor leg - Left leg 3 Limb ataxia 0 Sensory test 2 Language 3 Speech 2 Extinction and inattention 2  ROS: Unable to assess due to encephalopathy.  Past Medical History:  Diagnosis Date   Acquired compression deformity of vertebra    HTN (hypertension)    Osteoporosis    Family History  Problem Relation Age of Onset   Cancer Father    Alzheimer's disease Sister    Arthritis Paternal Grandmother    Social History:  reports that she has never smoked. She has never used smokeless tobacco. She reports that she does not currently use alcohol. She reports that she does not currently use drugs.   Prior to Admission medications   Medication Sig Start Date End Date Taking? Authorizing Provider  Biotin w/ Vitamins C & E (HAIR/SKIN/NAILS) 1250-7.5-7.5 MCG-MG-UNT CHEW Chew by mouth.    [provider]  Calcium-Magnesium-Vitamin D (CALCIUM 1200+D3 PO) Take by mouth.    [provider]  Cholecalciferol (VITAMIN D3) 25 MCG (1000 UT) CAPS Take 1 capsule by mouth daily.    [provider]  Magnesium 100 MG CAPS Take by mouth.    [provider]  metoprolol succinate (TOPROL-XL) 50 MG 24 hr tablet TAKE 1 TABLET (50 MG  TOTAL) BY MOUTH DAILY. **NEEDS APT FOR REFILLS** 06/16/21   Lorrene Reid, PA-C  Multiple Vitamin (MULTIVITAMIN WITH MINERALS) TABS tablet Take 1 tablet by mouth daily.    [provider]  naproxen (NAPROSYN) 500 MG tablet Take 1 tablet (500 mg total) by mouth 2 (two) times daily. Patient not taking: Reported on 08/24/2020 05/28/20   Hall-Potvin, [place], PA-C  vitamin B-12 (CYANOCOBALAMIN) 100 MCG tablet Take 100 mcg by mouth daily.    [provider]    Exam: Current vital signs: There were no vitals taken for this visit.  Physical Exam  Constitutional: Appears well-developed and well-nourished.  Psych: Could not completely evaluation due to encephalopathy, sedation and intubation. Eyes: No scleral injection HENT: No OP obstruction. Head: Normocephalic.  Cardiovascular: Normal rate and regular rhythm.  Respiratory: Effort normal, symmetric excursions bilaterally, no audible wheezing. GI: Soft.  No distension. There is no tenderness.  Skin: WDI  Neuro: Mental Status: Could not completely evaluation due to encephalopathy, sedation and intubation. Patient is not able to talk and only groaned. Speech mute. Pupils are round, pinpoint OS and 67mm OD sluggishly reactive to light. Eyes fixed in primary gaze Doll's eye (+). Facial movement is asymmetric on right Tone is normal. Bulk is normal.  Sensation Withdraws to pain on left Deep Tendon Reflexes: 2+ on right. Babinski (+)R Withdraws to pain on left Gait - Deferred  I have reviewed labs in epic and the pertinent results are: None available  I have  reviewed the images obtained: NCT head showed large left hemispheric intraparenchymal hemorrhage ~9.3cm AP and ~1.4cm midline shift.  Assessment: Kamarie Veno is a 87 y.o. female PMHx as noted above with large  left hemispheric intraparenchymal hemorrhage and ~1.4cm midline shift. The patient is full code and will treat aggressively until discussion with family.     Impression:  Large left hemispheric intraparenchymal hemorrhage. Hypertensive emergency. Acute coma. NIHSS 35 ICH 3.  Plan: Elevate head of bed keep head midline.  Blood pressure: MAP >65 SBP 120-160: - Start nicardipine infusion may increase to maximum 51mcg/min as needed to maintain SBP<140. - Labetalol 20mg  every 10 minutes as needed if SBP>140.  3% Na 250cc bolus followed by 75cc/hr infusion - Goal serum Na 145-155. Serum Na every 6 hours.  X-ray chest. Admit to neuro ICU. Consult neurosurgery. Analgosedation (fentanyl). Prophylactic levetiracetam 1,000mg  loading dose and 500mg  two times daily for 7-10 days. Hold antiplatelets and anticoagulation for now. IV fluids gentle hydration. Repeat CT head in 6 hours (or sooner if clinical worsening). Echocardiogram. MRI brain without contrast when able. MRA head and neck. Keep platelets >100k, INR<1.4 Replete electrolytes as needed. Labs: Coags, CBC, type and cross, CMP, Mg, Phos, fasting lipids, HbA1c, hCRP, troponins, urinalysis. Maintain O2 sats > 94%. Normothermia - For temperature >37.5C - acetaminophen 650mg  q4-6 hours PRN. Relative euglycemia (~ <180) and treat if hyperglycemia (>200 mg/dL)/hypoglycemia (< 60mg /dL). Euvolemia - Strict I/Os. Precautions: Airway and herniation, seizure, aspiration. PPx: SCDs for now, Senna/docusate, PPI. Precautions: Aspiration/seizure/fall  This patient is critically ill and at significant risk of neurological worsening, death and care requires constant monitoring of vital signs, hemodynamics,respiratory and cardiac monitoring, neurological assessment, discussion with family, other specialists and medical decision making of high complexity. I spent 75 minutes of neurocritical care time  in the care of  this patient. This was time spent independent of any time provided by nurse practitioner or PA.  Electronically signed by:  Lynnae Sandhoff, MD Page: [phone] 09/26/2021, 7:26  PM

## 2021-09-27 NOTE — ED Notes (Signed)
Pt's son in law who states he is also HPOA is at bedside. Dr. Gilford Raid informed. Paging Dr. Theda Sers at this time

## 2021-09-27 NOTE — ED Triage Notes (Signed)
PER EMS: Code Stroke called at 1902 for R sided deficits, LSN-1800. Pt arrived at 1919. Per EMS, family said pt began c/o L sided headache and then went unresponsive. They began CPR and pt awakened. Once awake, family witnessed a L sided gaze and pt was unable to move her R side. When medic arrived, pt was alert. En route to hospital, pt went unresponsive again and began to vomit. On arrival to ED, decision was made to intubate pt for airway protection prior to preceding to CT scan.   BP- 180/90, HR-68, CBG 101

## 2021-09-27 NOTE — Consult Note (Signed)
NAME:  Julia Clark, MRN:  [id number], DOB:  [birth year], LOS: 0 ADMISSION DATE:  09/26/2021, CONSULTATION DATE:  09/23/2021 REFERRING MD:  Theda Sers CHIEF COMPLAINT:  AMS   History of Present Illness:  Julia Clark is a 87 y.o. female who has a PMH as below.  She presented to [facility] ED 10/18 after had severe left sided headache followed by collapse and unresponsiveness.  She had brief CPR before waking up with right hemiplegia.  En route to ED, she vomited and became unresponsive again.  In ED, she was intubated for airway protection.  Head CT demonstrated large IPH in left temporal and occipital lobe with significant surrounding edema and mass effect with 1.4cm L to R MLS, effacement of left lateral and third ventricle as well as basilar cisterns.  Findings concerning for subfalcine and uncal herniation.  She was started on Keppra, Cleviprex, Propofol, Hypertonic saline. PCCM was asked to see in consultation for vent management.  At the time of our evaluation, son in law is at bedside and states he is HCPOA.  Granddaughter at bedside as well and confirms this.  They have spoken with additional family both local and out of state.  Knowing pt's prior wishes of not wanting any heroics or life sustaining measures, they have come to consensus to change code status to DNR and likely transition to comfort care in AM 10/19.  Pertinent  Medical History:  has Osteoporosis of multiple sites; Refuses treatment- with bisphosphonates or Prolia etc.; Vitamin D deficiency; Hypertension; Osteoarthritis; Family history of leukemia; Family history of Alzheimer's disease; Family history of arthritis; Personal history of atrial fibrillation; Status post kyphoplasty; Hx of cholecystectomy; History of appendectomy; Hyperlipidemia; and Stroke, hemorrhagic ([place]) on their problem list.   Significant Hospital Events: Including procedures, antibiotic start and stop dates in addition to other pertinent events   10/18 >  admit.  Interim History / Subjective:  Sedated on vent.  Objective:  Blood pressure (!) 119/53, pulse (!) 54, temperature (!) 97.5 F (36.4 C), temperature source Other (Comment), resp. rate 18, weight 57.7 kg, SpO2 100 %.    Vent Mode: PRVC FiO2 (%):  [60 %] 60 % Set Rate:  [18 bmp-60 bmp] 60 bmp Vt Set:  [490 mL] 490 mL PEEP:  [5 cmH20] 5 cmH20 Plateau Pressure:  [15 cmH20] 15 cmH20   Intake/Output Summary (Last 24 hours) at 09/12/2021 2332 Last data filed at 10/03/2021 2029 Gross per 24 hour  Intake 270.33 ml  Output --  Net 270.33 ml   Filed Weights   09/11/2021 1939 09/16/2021 2200  Weight: 61 kg 57.7 kg    Examination: General: Elderly female, resting in bed, critically ill. Neuro: Sedated, not responsive. HEENT: [place]/AT. Left pupil blown.  Right pupil sluggish. ETT in place. Cardiovascular: RRR, no M/R/G.  Lungs: Respirations even and unlabored.  CTA bilaterally, No W/R/R. Abdomen: BS x 4, soft, NT/ND.  Musculoskeletal: No gross deformities, no edema.  Skin: Intact, warm, no rashes.  Labs/imaging personally reviewed:  CT head 10/18 > large IPH in left temporal and occipital lobe with significant surrounding edema and mass effect with 1.4cm L to R MLS, effacement of left lateral and third ventricle as well as basilar cisterns.  Findings concerning for subfalcine and uncal herniation.  Assessment & Plan:   Large left temporal and occipital IPH with mass effect, surrounding edema, 1.4cm L to R MLS, effacement of left lateral and third ventricle as well as basilar cisterns.  Findings concerning for subfalcine and uncal herniation.  Acute hypoxic respiratory failure - s/p intubation. Hx HTN. DNR status.  Discussion: I have had an extensive discussion with son in law Education officer, community) and grand daughter at bedside. We discussed pts current circumstances and poor prognosis. We also discussed patient's prior wishes under circumstances such as this. The family has decided not to perform  resuscitation if arrest were to occur, but to otherwise continue with current medical support / therapies.  They will likely transition to full comfort measures in AM 10/19 after daughter has had a chance to visit.  Plan: - Continue all current supportive measures including vent support. - No escalation. - Avoid labs / imaging / procedures. - Family likely to transition to full comfort care in AM 10/19.   Best practice (evaluated daily):  Diet/type: NPO DVT prophylaxis: not indicated GI prophylaxis: PPI Lines: N/A Foley:  N/A Code Status:  DNR Last date of multidisciplinary goals of care discussion: 10/18 at 11pm.  RN Kennieth Rad present in room for goals of care discussion with son in law and grand daughter.  Labs   CBC: Recent Labs  Lab 09/26/2021 1946 09/30/2021 2048 10/05/2021 2138  WBC  --  8.1  --   NEUTROABS  --  6.1  --   HGB 11.6* 11.1* 11.2*  HCT 34.0* 34.2* 33.0*  MCV  --  96.3  --   PLT  --  198  --     Basic Metabolic Panel: Recent Labs  Lab 09/28/2021 1946 10/10/2021 2048 09/22/2021 2138  NA 135 135 139  K 3.6 3.8 3.6  CL 103 104  --   CO2  --  24  --   GLUCOSE 139* 134*  --   BUN 26* 24*  --   CREATININE 0.80 0.86  --   CALCIUM  --  8.9  --   MG  --  2.0  --   PHOS  --  3.4  --    GFR: CrCl cannot be calculated (Unknown ideal weight.). Recent Labs  Lab 09/13/2021 2048  WBC 8.1    Liver Function Tests: Recent Labs  Lab 09/12/2021 2048  AST 24  ALT 11  ALKPHOS 58  BILITOT 0.7  PROT 6.2*  ALBUMIN 3.6   No results for input(s): LIPASE, AMYLASE in the last 168 hours. No results for input(s): AMMONIA in the last 168 hours.  ABG    Component Value Date/Time   PHART 7.490 (H) 10/02/2021 2138   PCO2ART 35.9 10/03/2021 2138   PO2ART 413 (H) 09/30/2021 2138   HCO3 27.6 09/18/2021 2138   TCO2 29 10/03/2021 2138   O2SAT 100.0 10/03/2021 2138     Coagulation Profile: Recent Labs  Lab 09/23/2021 1957  INR 1.1    Cardiac Enzymes: No results  for input(s): CKTOTAL, CKMB, CKMBINDEX, TROPONINI in the last 168 hours.  HbA1C: No results found for: HGBA1C  CBG: Recent Labs  Lab 09/28/2021 2206  GLUCAP 115*    Review of Systems:   Unable to obtain as pt is encephalopathic.  Past Medical History:  She,  has a past medical history of Acquired compression deformity of vertebra, HTN (hypertension), and Osteoporosis.   Surgical History:   Past Surgical History:  Procedure Laterality Date   3 finger joint replaced      APPENDECTOMY  1944   DG GALL BLADDER  1960   KYPHOPLASTY       Social History:   reports that she has never smoked. She has never used smokeless tobacco. She reports that  she does not currently use alcohol. She reports that she does not currently use drugs.   Family History:  Her family history includes Alzheimer's disease in her sister; Arthritis in her paternal grandmother; Cancer in her father.   Allergies Allergies  Allergen Reactions   Ciprocin-Fluocin-Procin [Fluocinolone]    Fosamax [Alendronate Sodium]    Keflex [Cephalexin]      Home Medications  Prior to Admission medications   Medication Sig Start Date End Date Taking? Authorizing Provider  Ascorbic Acid (VITAMIN C) 1000 MG tablet Take 1,000 mg by mouth 2 (two) times daily.   Yes [provider]  B Complex Vitamins (VITAMIN B COMPLEX PO) Take 1 tablet by mouth 2 (two) times daily.   Yes [provider]  Biotin w/ Vitamins C & E (HAIR/SKIN/NAILS) 1250-7.5-7.5 MCG-MG-UNT CHEW Chew 1 tablet by mouth daily.   Yes [provider]  Calcium-Magnesium-Vitamin D (CALCIUM 1200+D3 PO) Take 1 tablet by mouth 2 (two) times daily.   Yes [provider]  Cholecalciferol (VITAMIN D3) 25 MCG (1000 UT) CAPS Take 1 capsule by mouth daily.   Yes [provider]  co-enzyme Q-10 30 MG capsule Take 30 mg by mouth daily.   Yes [provider]  ferrous sulfate 324 MG TBEC Take 324 mg by mouth daily.   Yes  [provider]  Garlic (GARLIQUE PO) Take 1 tablet by mouth daily.   Yes [provider]  Magnesium 100 MG CAPS Take 100 mg by mouth daily.   Yes [provider]  metoprolol succinate (TOPROL-XL) 50 MG 24 hr tablet TAKE 1 TABLET (50 MG TOTAL) BY MOUTH DAILY. **NEEDS APT FOR REFILLS** 06/16/21  Yes Abonza, Maritza, PA-C  Multiple Vitamin (MULTIVITAMIN WITH MINERALS) TABS tablet Take 1 tablet by mouth daily.   Yes [provider]  naproxen (NAPROSYN) 500 MG tablet Take 1 tablet (500 mg total) by mouth 2 (two) times daily. Patient taking differently: Take 500 mg by mouth daily as needed for moderate pain. 05/28/20  Yes Hall-Potvin, [place], PA-C  Omega 3 1000 MG CAPS Take 1,000 mg by mouth 2 (two) times daily.   Yes [provider]  vitamin E 180 MG (400 UNITS) capsule Take 400 Units by mouth daily.   Yes [provider]     Critical care time: 40 min.   Montey Hora, [facility] For pager details, please see AMION or use Epic chat  After 1900, please call [facility] for cross coverage needs 09/10/2021, 11:32 PM

## 2021-09-27 NOTE — Progress Notes (Addendum)
Pts belongings at bedside: clothing, shoes, wedding ring.    All of Pts belongings sent home with grand-daughter Elsie Lincoln.

## 2021-09-27 NOTE — Code Documentation (Addendum)
Responded to Code Stroke called at [facility] for R sided deficits, LSN-[id number]. Pt arrived at 1919. Per EMS, family said pt began c/o L sided headache and then went unresponsive. They began CPR and pt awakened. Once awake, family witnessed a L sided gaze and pt was unable to move her R side. When medic arrived, pt was alert. En route to hospital, pt went unresponsive again and began to vomit. On arrival to ED, decision was made to intubate pt for airway protection prior to preceding to CT scan. Pt was intubated with 20mg  etomidate and 140mg  succinylcholine. OGT placed and propofol was started. Pt was then transported to CT scan. CT-large intraparenchymal hemorrhage. Pt transported back to ED room, started on cleviprex, 3% saline, and fentanyl gtts. Due to instability, NIH unable to be obtained until after intubation. NIH-28. Plan to admit to ICU.

## 2021-09-27 NOTE — ED Provider Notes (Signed)
[facility] EMERGENCY DEPARTMENT Provider Note   CSN: [id number] Arrival date & time: 09/17/2021  1919  An emergency department physician performed an initial assessment on this suspected stroke patient at 87.  History Chief Complaint  Patient presents with   Code Stroke    Julia Clark is a 87 y.o. female.  Pt presents to the ED today with AMS.  Pt was LSN at 1800 and complained of a headache.  She collapsed in front of family.  EMS reports family did do CPR briefly.  When she aroused, she was noted to have right sided weakness.  MS worsened en route.  Pt obtunded upon arrival and is unable to give any hx of follow any commands.  Pt is not on blood thinners.      Past Medical History:  Diagnosis Date   Acquired compression deformity of vertebra    HTN (hypertension)    Osteoporosis     Patient Active Problem List   Diagnosis Date Noted   Stroke, hemorrhagic ([place]) 09/21/2021   Osteoporosis of multiple sites 11/17/2019   Refuses treatment- with bisphosphonates or Prolia etc. 11/17/2019   Vitamin D deficiency 11/17/2019   Hypertension 11/17/2019   Osteoarthritis 11/17/2019   Family history of leukemia 11/17/2019   Family history of Alzheimer's disease 11/17/2019   Family history of arthritis 11/17/2019   Personal history of atrial fibrillation 11/17/2019   Status post kyphoplasty 11/17/2019   Hx of cholecystectomy 11/17/2019   History of appendectomy 11/17/2019   Hyperlipidemia 11/17/2019    Past Surgical History:  Procedure Laterality Date   3 finger joint replaced      APPENDECTOMY  1944   DG GALL BLADDER  1960   KYPHOPLASTY       OB History   No obstetric history on file.     Family History  Problem Relation Age of Onset   Cancer Father    Alzheimer's disease Sister    Arthritis Paternal Grandmother     Social History   Tobacco Use   Smoking status: Never   Smokeless tobacco: Never  Substance Use Topics   Alcohol use: Not  Currently   Drug use: Not Currently    Home Medications Prior to Admission medications   Medication Sig Start Date End Date Taking? Authorizing Provider  Ascorbic Acid (VITAMIN C) 1000 MG tablet Take 1,000 mg by mouth 2 (two) times daily.   Yes [provider]  B Complex Vitamins (VITAMIN B COMPLEX PO) Take 1 tablet by mouth 2 (two) times daily.   Yes [provider]  Biotin w/ Vitamins C & E (HAIR/SKIN/NAILS) 1250-7.5-7.5 MCG-MG-UNT CHEW Chew 1 tablet by mouth daily.   Yes [provider]  Calcium-Magnesium-Vitamin D (CALCIUM 1200+D3 PO) Take 1 tablet by mouth 2 (two) times daily.   Yes [provider]  Cholecalciferol (VITAMIN D3) 25 MCG (1000 UT) CAPS Take 1 capsule by mouth daily.   Yes [provider]  co-enzyme Q-10 30 MG capsule Take 30 mg by mouth daily.   Yes [provider]  ferrous sulfate 324 MG TBEC Take 324 mg by mouth daily.   Yes [provider]  Garlic (GARLIQUE PO) Take 1 tablet by mouth daily.   Yes [provider]  Magnesium 100 MG CAPS Take 100 mg by mouth daily.   Yes [provider]  metoprolol succinate (TOPROL-XL) 50 MG 24 hr tablet TAKE 1 TABLET (50 MG TOTAL) BY MOUTH DAILY. **NEEDS APT FOR REFILLS** 06/16/21  Yes  Lorrene Reid, PA-C  Multiple Vitamin (MULTIVITAMIN WITH MINERALS) TABS tablet Take 1 tablet by mouth daily.   Yes [provider]  naproxen (NAPROSYN) 500 MG tablet Take 1 tablet (500 mg total) by mouth 2 (two) times daily. Patient taking differently: Take 500 mg by mouth daily as needed for moderate pain. 05/28/20  Yes Hall-Potvin, [place], PA-C  Omega 3 1000 MG CAPS Take 1,000 mg by mouth 2 (two) times daily.   Yes [provider]  vitamin E 180 MG (400 UNITS) capsule Take 400 Units by mouth daily.   Yes [provider]    Allergies    Ciprocin-fluocin-procin [fluocinolone], Fosamax [alendronate sodium], and Keflex [cephalexin]  Review of  Systems   Review of Systems  Unable to perform ROS: Mental status change   Physical Exam Updated Vital Signs BP (!) 119/53   Pulse (!) 54   Temp (!) 97.5 F (36.4 C) (Other (Comment)) Comment (Src): R Groin  Resp 18   Wt 57.7 kg   SpO2 100%   BMI 22.53 kg/m   Physical Exam Vitals and nursing note reviewed.  Constitutional:      General: She is in acute distress.  HENT:     Head: Normocephalic.     Right Ear: External ear normal.     Left Ear: External ear normal.     Nose: Nose normal.     Mouth/Throat:     Mouth: Mucous membranes are dry.  Cardiovascular:     Rate and Rhythm: Normal rate and regular rhythm.     Pulses: Normal pulses.     Heart sounds: Normal heart sounds.  Pulmonary:     Effort: Pulmonary effort is normal.     Breath sounds: Normal breath sounds.  Abdominal:     General: Abdomen is flat. Bowel sounds are normal.     Palpations: Abdomen is soft.  Musculoskeletal:        General: Normal range of motion.     Cervical back: Normal range of motion and neck supple.  Skin:    General: Skin is warm.     Capillary Refill: Capillary refill takes less than 2 seconds.  Neurological:     Mental Status: She is unresponsive.    ED Results / Procedures / Treatments   Labs (all labs ordered are listed, but only abnormal results are displayed) Labs Reviewed  CBC - Abnormal; Notable for the following components:      Result Value   RBC 3.55 (*)    Hemoglobin 11.1 (*)    HCT 34.2 (*)    All other components within normal limits  COMPREHENSIVE METABOLIC PANEL - Abnormal; Notable for the following components:   Glucose, Bld 134 (*)    BUN 24 (*)    Total Protein 6.2 (*)    All other components within normal limits  GLUCOSE, CAPILLARY - Abnormal; Notable for the following components:   Glucose-Capillary 115 (*)    All other components within normal limits  I-STAT CHEM 8, ED - Abnormal; Notable for the following components:   BUN 26 (*)    Glucose, Bld 139  (*)    Calcium, Ion 0.97 (*)    Hemoglobin 11.6 (*)    HCT 34.0 (*)    All other components within normal limits  I-STAT ARTERIAL BLOOD GAS, ED - Abnormal; Notable for the following components:   pH, Arterial 7.490 (*)    pO2, Arterial 413 (*)    Acid-Base Excess 4.0 (*)  HCT 33.0 (*)    Hemoglobin 11.2 (*)    All other components within normal limits  RESP PANEL BY RT-PCR (FLU A&B, COVID) ARPGX2  MRSA NEXT GEN BY PCR, NASAL  ETHANOL  DIFFERENTIAL  PROTIME-INR  APTT  MAGNESIUM  PHOSPHORUS  TRIGLYCERIDES  RAPID URINE DRUG SCREEN, HOSP PERFORMED  URINALYSIS, ROUTINE W REFLEX MICROSCOPIC  SODIUM  SODIUM  BLOOD GAS, ARTERIAL  TYPE AND SCREEN    EKG EKG Interpretation  Date/Time:  Tuesday September 27 2021 20:22:25 EDT Ventricular Rate:  57 PR Interval:  187 QRS Duration: 93 QT Interval:  505 QTC Calculation: 492 R Axis:   24 Text Interpretation: Sinus rhythm Low voltage, precordial leads Borderline prolonged QT interval No old tracing to compare Confirmed by Isla Pence [phone]) on 09/15/2021 8:24:37 PM  Radiology DG Chest Portable 1 View  Result Date: 09/26/2021 CLINICAL DATA:  Post intubation EXAM: PORTABLE CHEST 1 VIEW COMPARISON:  None. FINDINGS: Endotracheal tube terminates 5 cm above the carina. Lungs are clear.  No pleural effusion or pneumothorax. The heart is normal in size thoracic aortic atherosclerosis IMPRESSION: Endotracheal tube terminates 5 cm above the carina. Electronically Signed   By: Julian Hy M.D.   On: 09/10/2021 21:08   CT HEAD CODE STROKE WO CONTRAST  Result Date: 09/21/2021 CLINICAL DATA:  Code stroke. EXAM: CT HEAD WITHOUT CONTRAST TECHNIQUE: Contiguous axial images were obtained from the base of the skull through the vertex without intravenous contrast. COMPARISON:  None. FINDINGS: Brain: Large intraparenchymal hemorrhage in the left temporal, and occipital lobes, extending superiorly into the parietal and inferior left frontal  lobe, measuring up to 9.4 x 3.7 x 5.8 cm (AP x TR x CC) (series 3, image 15 and series 7, image 34), there is surrounding edema with significant mass effect and approximately 1.4 cm of left-to-right midline shift. Effacement of the left lateral ventricle and third ventricle, as well as the basilar cisterns, with significant mass effect on the midbrain. No definite midbrain hemorrhage. There is likely hemorrhage within the compressed left lateral ventricle. Vascular: No hyperdense vessel. Skull: No acute osseous abnormality. Sinuses/Orbits: Mild mucosal thickening in the ethmoid air cells and inferior frontal sinuses. Status post bilateral lens replacements. Other: The mastoids are well aerated. ASPECTS [facility] Stroke Program Early CT Score): Not calculated IMPRESSION: Large intraparenchymal hemorrhage centered in the left temporal and occipital lobe, extending superiorly into the left frontal and parietal lobes, measuring up to 9.4 cm, with significant surrounding edema and mass effect, with approximately 1.4 cm of left-to-right midline shift. There is effacement of left lateral ventricle and third ventricle, as well as the basilar cisterns, overall concerning for subfalcine and uncal herniation. Code stroke imaging results were communicated on 09/22/2021 at 8:22 pm to provider [facility] via secure text paging. Electronically Signed   By: Merilyn Baba M.D.   On: 09/16/2021 20:28    Procedures Procedure Name: Intubation Date/Time: 09/28/2021 8:00 PM Performed by: Isla Pence, MD Pre-anesthesia Checklist: Patient identified, Emergency Drugs available, Patient being monitored, Timeout performed and Suction available Oxygen Delivery Method: Ambu bag Preoxygenation: Pre-oxygenation with 100% oxygen Induction Type: Rapid sequence Ventilation: Mask ventilation without difficulty Laryngoscope Size: Glidescope and 3 Tube size: 7.5 mm Number of attempts: 1 Placement Confirmation: ETT inserted through vocal  cords under direct vision, Positive ETCO2 and Breath sounds checked- equal and bilateral Secured at: 24 cm Tube secured with: ETT holder Dental Injury: Teeth and Oropharynx as per pre-operative assessment       Medications Ordered in  ED Medications  fentaNYL (SUBLIMAZE) injection 50 mcg ( Intravenous Not Given 09/30/2021 2013)  fentaNYL 2550mcg in NS 273mL (70mcg/ml) infusion-PREMIX (50 mcg/hr Intravenous New Bag/Given 09/17/2021 2011)  fentaNYL (SUBLIMAZE) bolus via infusion 25 mcg (has no administration in time range)  propofol (DIPRIVAN) 1000 MG/100ML infusion (20 mcg/kg/min  61 kg Intravenous New Bag/Given 09/14/2021 2000)  sodium chloride (hypertonic) 3 % solution ( Intravenous New Bag/Given 10/03/2021 2029)  clevidipine (CLEVIPREX) infusion 0.5 mg/mL (2 mg/hr Intravenous Restarted 09/25/2021 2016)   stroke: mapping our early stages of recovery book (has no administration in time range)  acetaminophen (TYLENOL) tablet 650 mg (has no administration in time range)    Or  acetaminophen (TYLENOL) 160 MG/5ML solution 650 mg (has no administration in time range)    Or  acetaminophen (TYLENOL) suppository 650 mg (has no administration in time range)  senna-docusate (Senokot-S) tablet 1 tablet (has no administration in time range)  pantoprazole (PROTONIX) injection 40 mg (has no administration in time range)  levETIRAcetam (KEPPRA) IVPB 500 mg/100 mL premix (has no administration in time range)  etomidate (AMIDATE) injection (20 mg Intravenous Given 10/04/2021 1927)  succinylcholine (ANECTINE) syringe (140 mg Intravenous Given 10/04/2021 1928)  fentaNYL (SUBLIMAZE) injection (50 mcg Intravenous Given 10/02/2021 1933)  sodium chloride 3% (hypertonic) IV bolus 250 mL (0 mLs Intravenous Stopped 10/06/2021 2029)  levETIRAcetam (KEPPRA) IVPB 1000 mg/100 mL premix (1,000 mg Intravenous New Bag/Given 09/26/2021 2140)    ED Course  I have reviewed the triage vital signs and the nursing notes.  Pertinent labs &  imaging results that were available during my care of the patient were reviewed by me and considered in my medical decision making (see chart for details).    MDM Rules/Calculators/A&P                           Code stroke called by EMS.  Pt intubated prior to CT for airway protection.  She was obtunded and looked like she had already vomited.    Pt has a large IPH on CT.  Neurology started pt on bp meds and ordered 3% na bolus.  Pt started on keppra.    CRITICAL CARE Performed by: Isla Pence   Total critical care time: 30 minutes  Critical care time was exclusive of separately billable procedures and treating other patients.  Critical care was necessary to treat or prevent imminent or life-threatening deterioration.  Critical care was time spent personally by me on the following activities: development of treatment plan with patient and/or surrogate as well as nursing, discussions with consultants, evaluation of patient's response to treatment, examination of patient, obtaining history from patient or surrogate, ordering and performing treatments and interventions, ordering and review of laboratory studies, ordering and review of radiographic studies, pulse oximetry and re-evaluation of patient's condition.  Final Clinical Impression(s) / ED Diagnoses Final diagnoses:  Hemorrhagic stroke [facility])    Rx / DC Orders ED Discharge Orders     None        Isla Pence, MD 10/03/2021 2252

## 2021-09-27 NOTE — Progress Notes (Signed)
RT and RN transported patient from ED to 4N17 without event.

## 2021-09-28 DIAGNOSIS — G936 Cerebral edema: Secondary | ICD-10-CM

## 2021-09-28 DIAGNOSIS — I619 Nontraumatic intracerebral hemorrhage, unspecified: Secondary | ICD-10-CM | POA: Diagnosis not present

## 2021-09-28 DIAGNOSIS — J9601 Acute respiratory failure with hypoxia: Secondary | ICD-10-CM | POA: Diagnosis not present

## 2021-09-28 LAB — SODIUM
Sodium: 142 mmol/L (ref 135–145)
Sodium: 148 mmol/L — ABNORMAL HIGH (ref 135–145)

## 2021-09-28 LAB — RAPID URINE DRUG SCREEN, HOSP PERFORMED
Amphetamines: NOT DETECTED
Barbiturates: NOT DETECTED
Benzodiazepines: NOT DETECTED
Cocaine: NOT DETECTED
Opiates: NOT DETECTED
Tetrahydrocannabinol: NOT DETECTED

## 2021-09-28 LAB — URINALYSIS, ROUTINE W REFLEX MICROSCOPIC
Bilirubin Urine: NEGATIVE
Glucose, UA: NEGATIVE mg/dL
Hgb urine dipstick: NEGATIVE
Ketones, ur: 20 mg/dL — AB
Leukocytes,Ua: NEGATIVE
Nitrite: NEGATIVE
Protein, ur: NEGATIVE mg/dL
Specific Gravity, Urine: 1.012 (ref 1.005–1.030)
pH: 7 (ref 5.0–8.0)

## 2021-09-28 LAB — MRSA NEXT GEN BY PCR, NASAL: MRSA by PCR Next Gen: NOT DETECTED

## 2021-09-28 LAB — TRIGLYCERIDES: Triglycerides: 50 mg/dL (ref <150)

## 2021-09-28 LAB — ABO/RH: ABO/RH(D): O POS

## 2021-09-28 MED ORDER — CHLORHEXIDINE GLUCONATE 0.12% ORAL RINSE (MEDLINE KIT)
15.0000 mL | Freq: Two times a day (BID) | OROMUCOSAL | Status: DC
  Administered 2021-09-28: 15 mL via OROMUCOSAL

## 2021-09-28 MED ORDER — ORAL CARE MOUTH RINSE
15.0000 mL | OROMUCOSAL | Status: DC
  Administered 2021-09-28 (×2): 15 mL via OROMUCOSAL

## 2021-09-28 MED ORDER — MORPHINE SULFATE (PF) 2 MG/ML IV SOLN: 2.0000 mg | INTRAVENOUS | Status: DC | PRN

## 2021-09-28 MED ORDER — DIPHENHYDRAMINE HCL 50 MG/ML IJ SOLN: 25.0000 mg | INTRAMUSCULAR | Status: DC | PRN

## 2021-09-28 MED ORDER — GLYCOPYRROLATE 1 MG PO TABS
1.0000 mg | ORAL_TABLET | ORAL | Status: DC | PRN
  Filled 2021-09-28: qty 1

## 2021-09-28 MED ORDER — GLYCOPYRROLATE 0.2 MG/ML IJ SOLN
0.2000 mg | INTRAMUSCULAR | Status: DC | PRN
  Filled 2021-09-28: qty 1

## 2021-09-28 MED ORDER — POLYVINYL ALCOHOL 1.4 % OP SOLN
1.0000 [drp] | Freq: Four times a day (QID) | OPHTHALMIC | Status: DC | PRN
  Filled 2021-09-28: qty 15

## 2021-09-28 MED ORDER — DEXTROSE 5 % IV SOLN: INTRAVENOUS | Status: DC

## 2021-09-28 MED ORDER — MORPHINE 100MG IN NS 100ML (1MG/ML) PREMIX INFUSION
0.0000 mg/h | INTRAVENOUS | Status: DC
  Administered 2021-09-28: 5 mg/h via INTRAVENOUS
  Filled 2021-09-28: qty 100

## 2021-09-28 MED ORDER — MORPHINE BOLUS VIA INFUSION
5.0000 mg | INTRAVENOUS | Status: DC | PRN
Start: 2021-09-28 — End: 2021-09-29
  Filled 2021-09-28: qty 5

## 2021-09-28 NOTE — Progress Notes (Signed)
OT Cancellation Note  Patient Details Name: Julia Clark MRN: [id number] DOB: 24-Oct-1930   Cancelled Treatment:    Reason Eval/Treat Not Completed: Active bedrest order. Possible comfort care. Will follow and assess if appropriate.   Ramond Dial, OT/L   Acute OT Clinical Specialist Acute Rehabilitation Services Pager [phone] Office [phone]  09/28/2021, 7:35 AM

## 2021-09-28 NOTE — Progress Notes (Signed)
eLink Physician-Brief Progress Note Patient Name: Julia Clark DOB: 03-Jan-1930 MRN: [id number]   Date of Service  09/28/2021  HPI/Events of Note  Pt with large ICH  eICU Interventions  Likely will tx to comfort care today     Intervention Category Evaluation Type: New Patient Evaluation  Tilden Dome 09/28/2021, 12:19 AM

## 2021-09-28 NOTE — Progress Notes (Signed)
PT Cancellation Note  Patient Details Name: Julia Clark MRN: [id number] DOB: December 21, 1929   Cancelled Treatment:    Reason Eval/Treat Not Completed: Active bedrest order Possible transition to comfort care. Will follow and assess if appropriate.  Naresh Althaus A. Gilford Rile PT, DPT Acute Rehabilitation Services Pager [phone] Office [phone]     Linna Hoff 09/28/2021, 7:47 AM

## 2021-09-28 NOTE — Progress Notes (Signed)
RT note-Patient was extubated to comfort care, family at the bedside.

## 2021-09-28 NOTE — Progress Notes (Signed)
SLP Cancellation Note  Patient Details Name: Marny Smethers MRN: [id number] DOB: 01-31-1930   Cancelled treatment:       Reason Eval/Treat Not Completed: Patient not medically ready   Marti Acebo, Katherene Ponto 09/28/2021, 8:15 AM

## 2021-09-28 NOTE — Progress Notes (Signed)
Nutrition Brief Note  Chart reviewed. Per MD notes, likely transition to comfort care.  No further nutrition interventions planned at this time.  Please re-consult as needed.   Loistine Chance, RD, LDN, [place] Registered Dietitian II Certified Diabetes Care and Education Specialist Please refer to [facility] for RD and/or RD on-call/weekend/after hours pager

## 2021-09-28 NOTE — Progress Notes (Signed)
Admission was not done, family needs time as pt was just changed to [place].

## 2021-09-28 NOTE — Progress Notes (Addendum)
STROKE TEAM PROGRESS NOTE   SUBJECTIVE (INTERVAL HISTORY) Multiple family members are at the bedside.  Patient still intubated, with unequal pupils, poor prognosis.  CT showed left parietal temporal ICH with significant midline shift.  Family understood and is waiting for further family arrive to proceed with comfort care measures.   OBJECTIVE Temp:  [96.3 F (35.7 C)-99.5 F (37.5 C)] 99.5 F (37.5 C) (10/19 1200) Pulse Rate:  [45-64] 60 (10/19 1130) Cardiac Rhythm: Sinus bradycardia (10/19 0800) Resp:  [12-33] 17 (10/19 1130) BP: (88-187)/(42-81) 124/46 (10/19 1030) SpO2:  [77 %-100 %] 77 % (10/19 1130) FiO2 (%):  [40 %-60 %] 40 % (10/19 0800) Weight:  [57.7 kg-61 kg] 57.7 kg (10/18 2200)  Recent Labs  Lab 09/13/2021 2206  GLUCAP 115*   Recent Labs  Lab 10/03/2021 1946 10/06/2021 2048 09/13/2021 2138 09/28/21 0343 09/28/21 0730  NA 135 135 139 142 148*  K 3.6 3.8 3.6  --   --   CL 103 104  --   --   --   CO2  --  24  --   --   --   GLUCOSE 139* 134*  --   --   --   BUN 26* 24*  --   --   --   CREATININE 0.80 0.86  --   --   --   CALCIUM  --  8.9  --   --   --   MG  --  2.0  --   --   --   PHOS  --  3.4  --   --   --    Recent Labs  Lab 09/26/2021 2048  AST 24  ALT 11  ALKPHOS 58  BILITOT 0.7  PROT 6.2*  ALBUMIN 3.6   Recent Labs  Lab 09/19/2021 1946 10/06/2021 2048 10/09/2021 2138  WBC  --  8.1  --   NEUTROABS  --  6.1  --   HGB 11.6* 11.1* 11.2*  HCT 34.0* 34.2* 33.0*  MCV  --  96.3  --   PLT  --  198  --    No results for input(s): CKTOTAL, CKMB, CKMBINDEX, TROPONINI in the last 168 hours. Recent Labs    10/04/2021 1957  LABPROT 14.1  INR 1.1   Recent Labs    09/28/21 0316  COLORURINE YELLOW  LABSPEC 1.012  PHURINE 7.0  GLUCOSEU NEGATIVE  HGBUR NEGATIVE  BILIRUBINUR NEGATIVE  KETONESUR 20*  PROTEINUR NEGATIVE  NITRITE NEGATIVE  LEUKOCYTESUR NEGATIVE       Component Value Date/Time   CHOL 207 (H) 02/16/2020 0919   TRIG 50 09/28/2021 0343   HDL  78 02/16/2020 0919   CHOLHDL 2.7 02/16/2020 0919   LDLCALC 115 (H) 02/16/2020 0919   No results found for: HGBA1C    Component Value Date/Time   LABOPIA NONE DETECTED 09/28/2021 0316   COCAINSCRNUR NONE DETECTED 09/28/2021 0316   LABBENZ NONE DETECTED 09/28/2021 0316   AMPHETMU NONE DETECTED 09/28/2021 0316   THCU NONE DETECTED 09/28/2021 0316   LABBARB NONE DETECTED 09/28/2021 0316    Recent Labs  Lab 10/04/2021 1940  ETH <10    I have personally reviewed the radiological images below and agree with the radiology interpretations.  DG Lumbar Spine 2-3 Views  Result Date: 09/22/2021 CLINICAL DATA:  Back pain for 2 weeks. Right hip pain. Osteoporosis and prior vertebral fractures. EXAM: LUMBAR SPINE - 2-3 VIEW COMPARISON:  01/28/2020, without report FINDINGS: Advanced osteopenia. A moderate L2 compression deformity  is similar. Vertebral augmentation at T12 and L1 is grossly similar, suboptimally evaluated secondary to technique and the extent of osteopenia. T11, L3, L4, and L5 vertebral body height maintained. Mild for age spondylosis. IMPRESSION: Grossly similar appearance of the lumbar spine compared to 01/28/2020. Compression deformities involving T12 through L2 with prior vertebral augmentation at T12 and L1. Advanced osteopenia, mildly limiting evaluation. Electronically Signed   By: Abigail Miyamoto M.D.   On: 09/22/2021 13:34   DG Chest Portable 1 View  Result Date: 10/08/2021 CLINICAL DATA:  Post intubation EXAM: PORTABLE CHEST 1 VIEW COMPARISON:  None. FINDINGS: Endotracheal tube terminates 5 cm above the carina. Lungs are clear.  No pleural effusion or pneumothorax. The heart is normal in size thoracic aortic atherosclerosis IMPRESSION: Endotracheal tube terminates 5 cm above the carina. Electronically Signed   By: Julian Hy M.D.   On: 09/11/2021 21:08   DG HIP UNILAT WITH PELVIS 2-3 VIEWS RIGHT  Result Date: 09/13/2021 CLINICAL DATA:  Intermittent posterior right hip pain  for 3 days. No injury. EXAM: DG HIP (WITH OR WITHOUT PELVIS) 2V RIGHT COMPARISON:  Lumbar spine radiographs, 01/28/2020 FINDINGS: No fracture or bone lesion. Hip joint is normally spaced and aligned. No significant arthropathic changes. IMPRESSION: 1. No fracture or bone lesion. 2. No hip joint abnormality. Electronically Signed   By: Lajean Manes M.D.   On: 09/13/2021 09:33   CT HEAD CODE STROKE WO CONTRAST  Result Date: 09/24/2021 CLINICAL DATA:  Code stroke. EXAM: CT HEAD WITHOUT CONTRAST TECHNIQUE: Contiguous axial images were obtained from the base of the skull through the vertex without intravenous contrast. COMPARISON:  None. FINDINGS: Brain: Large intraparenchymal hemorrhage in the left temporal, and occipital lobes, extending superiorly into the parietal and inferior left frontal lobe, measuring up to 9.4 x 3.7 x 5.8 cm (AP x TR x CC) (series 3, image 15 and series 7, image 34), there is surrounding edema with significant mass effect and approximately 1.4 cm of left-to-right midline shift. Effacement of the left lateral ventricle and third ventricle, as well as the basilar cisterns, with significant mass effect on the midbrain. No definite midbrain hemorrhage. There is likely hemorrhage within the compressed left lateral ventricle. Vascular: No hyperdense vessel. Skull: No acute osseous abnormality. Sinuses/Orbits: Mild mucosal thickening in the ethmoid air cells and inferior frontal sinuses. Status post bilateral lens replacements. Other: The mastoids are well aerated. ASPECTS [facility] Stroke Program Early CT Score): Not calculated IMPRESSION: Large intraparenchymal hemorrhage centered in the left temporal and occipital lobe, extending superiorly into the left frontal and parietal lobes, measuring up to 9.4 cm, with significant surrounding edema and mass effect, with approximately 1.4 cm of left-to-right midline shift. There is effacement of left lateral ventricle and third ventricle, as well as the  basilar cisterns, overall concerning for subfalcine and uncal herniation. Code stroke imaging results were communicated on 09/23/2021 at 8:22 pm to provider [facility] via secure text paging. Electronically Signed   By: Merilyn Baba M.D.   On: 09/22/2021 20:28     PHYSICAL EXAM  Temp:  [96.3 F (35.7 C)-99.5 F (37.5 C)] 99.5 F (37.5 C) (10/19 1200) Pulse Rate:  [45-64] 60 (10/19 1130) Resp:  [12-33] 17 (10/19 1130) BP: (88-187)/(42-81) 124/46 (10/19 1030) SpO2:  [77 %-100 %] 77 % (10/19 1130) FiO2 (%):  [40 %-60 %] 40 % (10/19 0800) Weight:  [57.7 kg-61 kg] 57.7 kg (10/18 2200)  General - Well nourished, well developed, intubated off propofol.  Ophthalmologic - fundi not  visualized due to noncooperation.  Cardiovascular - Regular rate and rhythm.  Neuro - intubated off propofol, limited exam due to comfort care request from family. eyes closed, not following commands. With forced eye opening, eyes in left gaze position, not blinking to visual threat, not tracking, left pupil 5 mm, right pupil 3 mm, nonreactive.  Breathing over the vent.  Facial symmetry not able to test due to ET tube.  Tongue protrusion not cooperative.  No spontaneous movement of all extremities. Sensation, coordination and gait not tested.   ASSESSMENT/PLAN Ms. Julia Clark is a 87 y.o. female with history of hypertension, hyperlipidemia admitted for headache, unresponsiveness, right-sided weakness and vomiting. No tPA given due to [place].    ICH:  left large temporal and occipital ICH, hypertensive versus CAA CT showed large left temporal and occipital ICH with midline shift 1.4 cm, concerning for subfalcine and uncal herniation. UDS negative No antithrombotic prior to admission, now on No antithrombotic.  Disposition: Poor prognosis, family would like to proceed with comfort care once more family arrives  Cerebral edema Significant midline shift on CT poor prognosis 3% saline at 75, will DC once start comfort  care On Keppra, will DC once start comfort care  Hypertension Stable now BP goal less than 140  Other Stroke Risk Factors Advanced age  Other Active Problems Bradycardia  Hospital day # 1  I had long discussion with family at bedside, updated pt current condition, treatment plan and potential prognosis, and answered all the questions.  They expressed understanding and appreciation.    Rosalin Hawking, MD PhD Stroke Neurology 09/28/2021 5:12 PM    To contact Stroke Continuity provider, please refer to [email or web address] After hours, contact General Neurology

## 2021-09-28 NOTE — Progress Notes (Signed)
NAME:  Venie Montesinos, MRN:  [id number], DOB:  05-24-1930, LOS: 1 ADMISSION DATE:  10/01/2021, CONSULTATION DATE:  09/16/2021 REFERRING MD:  Theda Sers CHIEF COMPLAINT:  AMS   History of Present Illness:  Garrett Bowring is a 87 y.o. female who presented to [facility] ED 10/18 for severe left sided headache followed by collapse and unresponsiveness.  She had brief CPR before waking up with right hemiplegia.  En route to ED, she vomited and became unresponsive again.  In the ED, she was intubated for airway protection.  Head CT demonstrated large IPH in left temporal and occipital lobe with significant surrounding edema and mass effect with 1.4cm L to R midline shift, effacement of left lateral and third ventricle as well as basilar cisterns. Findings concerning for subfalcine and uncal herniation. She was started on Keppra, Cleviprex, Propofol, Hypertonic saline.   PCCM was asked to see in consultation for vent management. At the time of PCCM evaluation, son-in-law at bedside and states he is HCPOA. Granddaughter at bedside as well and confirms this. They have spoken with additional family both local and out of state. Knowing patient's prior wishes of not wanting any heroic or life-sustaining measures, they have come to consensus to change code status to DNR and likely transition to comfort care 10/19.  Pertinent Medical History:   Past Medical History:  Diagnosis Date   Acquired compression deformity of vertebra    HTN (hypertension)    Osteoporosis    Significant Hospital Events: Including procedures, antibiotic start and stop dates in addition to other pertinent events   10/18 - admit. Made DNR by family. 10/19 - Worsening neurologic status overnight with blown L pupil. Family at bedside confirm DNR and wishes to transition to [place] once remaining family has arrived. Comfort care orders placed 1033.  Interim History / Subjective:  Family visiting at bedside. Requesting transitioning to comfort  measures.  Objective:  Blood pressure (!) 104/45, pulse (!) 50, temperature (!) 97.1 F (36.2 C), temperature source Axillary, resp. rate 18, weight 57.7 kg, SpO2 100 %.    Vent Mode: PRVC FiO2 (%):  [40 %-60 %] 40 % Set Rate:  [18 bmp-60 bmp] 18 bmp Vt Set:  [490 mL] 490 mL PEEP:  [5 cmH20] 5 cmH20 Plateau Pressure:  [15 cmH20-17 cmH20] 16 cmH20   Intake/Output Summary (Last 24 hours) at 09/28/2021 0847 Last data filed at 09/28/2021 0800 Gross per 24 hour  Intake 1365.84 ml  Output 1150 ml  Net 215.84 ml    Filed Weights   09/21/2021 1939 10/09/2021 2200  Weight: 61 kg 57.7 kg   Physical Examination: General: Elderly woman, intubated/sedated, in NAD. HEENT: [place]/AT, anicteric sclera, blown L pupil, sluggish/nearly nonreactive R pupil, moist mucous membranes. Neuro: Comatose. Does not respond to verbal, tactile or noxious stimuli. Not following commands. No spontaneous movement of extremities. CV: RRR, no m/g/r. PULM: Breathing even and unlabored on vent (PEEP 5, FiO2 40%). Lung fields CTAB. GI: Soft, nontender, nondistended. Normoactive bowel sounds. Extremities: Trace symmetric LE edema noted. Skin: Warm/dry, no rashes.  Labs/imaging personally reviewed:  CT head 10/18 > large IPH in left temporal and occipital lobe with significant surrounding edema and mass effect with 1.4cm L to R MLS, effacement of left lateral and third ventricle as well as basilar cisterns.  Findings concerning for subfalcine and uncal herniation.  Assessment & Plan:   Large left temporal and occipital IPH with mass effect Surrounding edema, 1.4cm L to R midline shift, effacement of left lateral and  third ventricle as well as basilar cisterns; fndings concerning for subfalcine and uncal herniation. Acute hypoxic respiratory failure - s/p intubation Hypertension DNR status  Discussion: Rahul Desai, PA-C (CCM) had extensive discussion with son in law Education officer, community) and granddaughter at bedside 10/18. Discussed  patient's current circumstances and poor prognosis/patient's prior wishes under circumstances such as these. The family has decided not to perform resuscitation if arrest were to occur (DNR status) but to otherwise continue with current medical support/therapies. Request transition to full comfort measures 10/19AM after patient's daughter has had a chance to visit.  - No escalation of care per family/prior patient wishes - Discontinue labs, imaging, procedures; continue current level of care - Transition to comfort measures only, orders placed 1033 10/19  Best practice (evaluated daily):  Diet/type: NPO DVT prophylaxis: not indicated GI prophylaxis: PPI Lines: N/A Foley:  N/A Code Status:  DNR Last date of multidisciplinary goals of care discussion: 10/19 at bedside with all available family members. Request transition to comfort care this morning.  Critical care time: N/A   Rhae Lerner [facility] Pulmonary & Critical Care 09/28/21 8:49 AM  Please see [email or web address] for pager details.  From 7A-7P if no response, please call [phone] After hours, please call ELink [phone]

## 2021-10-11 NOTE — Death Summary Note (Addendum)
DEATH SUMMARY   Patient Details  Name: Julia Clark MRN: [id number] DOB: 10-May-1930  Admission/Discharge Information   Admit Date:  10-12-21  Date of Death: Date of Death: 10/14/2021  Time of Death: Time of Death: 0210  Length of Stay: 2  Referring Physician: Lorrene Reid, PA-C   Reason(s) for Hospitalization  Stroke - massive L temporal intraparenchymal hemorrhage  Diagnoses   Preliminary cause of death: Stroke - Massive left temporal intraparenchymal hemorrhage with subfalcine/uncal herniation (ICH score 3) Secondary Diagnoses (including complications and co-morbidities):  Active Problems:   Stroke, hemorrhagic (HCC)   Acute respiratory failure with hypoxia (HCC)   Encephalopathy acute   DNR (do not resuscitate)  Brief Hospital Course (including significant findings, care, treatment, and services provided and events leading to death)  Julia Clark is a 87 y.o. year old female who presented to [facility] ED October 13, 2023 for severe left sided headache followed by collapse and unresponsiveness.  She had brief CPR before waking up with right hemiplegia.  En route to ED, she vomited and became unresponsive again.   In the ED, she was intubated for airway protection.  Head CT demonstrated large IPH in left temporal and occipital lobe with significant surrounding edema and mass effect with 1.4cm L to R midline shift, effacement of left lateral and third ventricle as well as basilar cisterns. Findings concerning for subfalcine and uncal herniation. She was started on Keppra, Cleviprex, Propofol, Hypertonic saline.    PCCM was asked to see in consultation for vent management. At the time of PCCM evaluation, son-in-law at bedside and states he is HCPOA. Granddaughter at bedside as well and confirms this. They have spoken with additional family both local and out of state.  Given catastrophic brain imaging findings and knowing patient's prior wishes of not wanting any heroic or life-sustaining  measures, patient's family opted to change code status to DNR. Patient was transitioned to comfort care 10/19 at 1030. She was transferred to the palliative medicine floor 10/19PM and expired 10-15-23 at [place].   Pertinent Labs and Studies  Significant Diagnostic Studies DG Lumbar Spine 2-3 Views  Result Date: 09/22/2021 CLINICAL DATA:  Back pain for 2 weeks. Right hip pain. Osteoporosis and prior vertebral fractures. EXAM: LUMBAR SPINE - 2-3 VIEW COMPARISON:  01/28/2020, without report FINDINGS: Advanced osteopenia. A moderate L2 compression deformity is similar. Vertebral augmentation at T12 and L1 is grossly similar, suboptimally evaluated secondary to technique and the extent of osteopenia. T11, L3, L4, and L5 vertebral body height maintained. Mild for age spondylosis. IMPRESSION: Grossly similar appearance of the lumbar spine compared to 01/28/2020. Compression deformities involving T12 through L2 with prior vertebral augmentation at T12 and L1. Advanced osteopenia, mildly limiting evaluation. Electronically Signed   By: Abigail Miyamoto M.D.   On: 09/22/2021 13:34   DG Chest Portable 1 View  Result Date: 10-12-2021 CLINICAL DATA:  Post intubation EXAM: PORTABLE CHEST 1 VIEW COMPARISON:  None. FINDINGS: Endotracheal tube terminates 5 cm above the carina. Lungs are clear.  No pleural effusion or pneumothorax. The heart is normal in size thoracic aortic atherosclerosis IMPRESSION: Endotracheal tube terminates 5 cm above the carina. Electronically Signed   By: Julian Hy M.D.   On: 2021/10/12 21:08   DG HIP UNILAT WITH PELVIS 2-3 VIEWS RIGHT  Result Date: 09/13/2021 CLINICAL DATA:  Intermittent posterior right hip pain for 3 days. No injury. EXAM: DG HIP (WITH OR WITHOUT PELVIS) 2V RIGHT COMPARISON:  Lumbar spine radiographs, 01/28/2020 FINDINGS: No fracture or bone lesion. Hip  joint is normally spaced and aligned. No significant arthropathic changes. IMPRESSION: 1. No fracture or bone lesion. 2. No  hip joint abnormality. Electronically Signed   By: Lajean Manes M.D.   On: 09/13/2021 09:33   CT HEAD CODE STROKE WO CONTRAST  Result Date: 09/28/2021 CLINICAL DATA:  Code stroke. EXAM: CT HEAD WITHOUT CONTRAST TECHNIQUE: Contiguous axial images were obtained from the base of the skull through the vertex without intravenous contrast. COMPARISON:  None. FINDINGS: Brain: Large intraparenchymal hemorrhage in the left temporal, and occipital lobes, extending superiorly into the parietal and inferior left frontal lobe, measuring up to 9.4 x 3.7 x 5.8 cm (AP x TR x CC) (series 3, image 15 and series 7, image 34), there is surrounding edema with significant mass effect and approximately 1.4 cm of left-to-right midline shift. Effacement of the left lateral ventricle and third ventricle, as well as the basilar cisterns, with significant mass effect on the midbrain. No definite midbrain hemorrhage. There is likely hemorrhage within the compressed left lateral ventricle. Vascular: No hyperdense vessel. Skull: No acute osseous abnormality. Sinuses/Orbits: Mild mucosal thickening in the ethmoid air cells and inferior frontal sinuses. Status post bilateral lens replacements. Other: The mastoids are well aerated. ASPECTS [facility] Stroke Program Early CT Score): Not calculated IMPRESSION: Large intraparenchymal hemorrhage centered in the left temporal and occipital lobe, extending superiorly into the left frontal and parietal lobes, measuring up to 9.4 cm, with significant surrounding edema and mass effect, with approximately 1.4 cm of left-to-right midline shift. There is effacement of left lateral ventricle and third ventricle, as well as the basilar cisterns, overall concerning for subfalcine and uncal herniation. Code stroke imaging results were communicated on 09/15/2021 at 8:22 pm to provider [facility] via secure text paging. Electronically Signed   By: Merilyn Baba M.D.   On: 10/07/2021 20:28    Microbiology Recent  Results (from the past 240 hour(s))  Resp Panel by RT-PCR (Flu A&B, Covid) Nasopharyngeal Swab     Status: None   Collection Time: 09/28/2021  7:40 PM   Specimen: Nasopharyngeal Swab; Nasopharyngeal(NP) swabs in vial transport medium  Result Value Ref Range Status   SARS Coronavirus 2 by RT PCR NEGATIVE NEGATIVE Final    Comment: (NOTE) SARS-CoV-2 target nucleic acids are NOT DETECTED.  The SARS-CoV-2 RNA is generally detectable in upper respiratory specimens during the acute phase of infection. The lowest concentration of SARS-CoV-2 viral copies this assay can detect is 138 copies/mL. A negative result does not preclude SARS-Cov-2 infection and should not be used as the sole basis for treatment or other patient management decisions. A negative result may occur with  improper specimen collection/handling, submission of specimen other than nasopharyngeal swab, presence of viral mutation(s) within the areas targeted by this assay, and inadequate number of viral copies(<138 copies/mL). A negative result must be combined with clinical observations, patient history, and epidemiological information. The expected result is Negative.  Fact Sheet for Patients:  EntrepreneurPulse.com.au  Fact Sheet for Healthcare Providers:  IncredibleEmployment.be  This test is no t yet approved or cleared by the [place] FDA and  has been authorized for detection and/or diagnosis of SARS-CoV-2 by FDA under an Emergency Use Authorization (EUA). This EUA will remain  in effect (meaning this test can be used) for the duration of the COVID-19 declaration under Section 564(b)(1) of the Act, 21 U.S.C.section 360bbb-3(b)(1), unless the authorization is terminated  or revoked sooner.       Influenza A by PCR NEGATIVE NEGATIVE Final  Influenza B by PCR NEGATIVE NEGATIVE Final    Comment: (NOTE) The Xpert Xpress SARS-CoV-2/FLU/RSV plus assay is intended as an aid in the  diagnosis of influenza from Nasopharyngeal swab specimens and should not be used as a sole basis for treatment. Nasal washings and aspirates are unacceptable for Xpert Xpress SARS-CoV-2/FLU/RSV testing.  Fact Sheet for Patients: EntrepreneurPulse.com.au  Fact Sheet for Healthcare Providers: IncredibleEmployment.be  This test is not yet approved or cleared by the [place] FDA and has been authorized for detection and/or diagnosis of SARS-CoV-2 by FDA under an Emergency Use Authorization (EUA). This EUA will remain in effect (meaning this test can be used) for the duration of the COVID-19 declaration under Section 564(b)(1) of the Act, 21 U.S.C. section 360bbb-3(b)(1), unless the authorization is terminated or revoked.  Performed at [facility], [place] [address], [place], [place] [address]   MRSA Next Gen by PCR, Nasal     Status: None   Collection Time: 09/10/2021 10:36 PM   Specimen: Nasal Mucosa; Nasal Swab  Result Value Ref Range Status   MRSA by PCR Next Gen NOT DETECTED NOT DETECTED Final    Comment: (NOTE) The GeneXpert MRSA Assay (FDA approved for NASAL specimens only), is one component of a comprehensive MRSA colonization surveillance program. It is not intended to diagnose MRSA infection nor to guide or monitor treatment for MRSA infections. Test performance is not FDA approved in patients less than 86 years old. Performed at [facility], [place] [address], [place], [place] [address]    Lab Basic Metabolic Panel: Recent Labs  Lab 10/06/2021 1946 10/09/2021 2048 09/16/2021 2138 09/28/21 0343 09/28/21 0730  NA 135 135 139 142 148*  K 3.6 3.8 3.6  --   --   CL 103 104  --   --   --   CO2  --  24  --   --   --   GLUCOSE 139* 134*  --   --   --   BUN 26* 24*  --   --   --   CREATININE 0.80 0.86  --   --   --   CALCIUM  --  8.9  --   --   --   MG  --  2.0  --   --   --   PHOS  --  3.4  --   --   --    Liver Function  Tests: Recent Labs  Lab 09/30/2021 2048  AST 24  ALT 11  ALKPHOS 58  BILITOT 0.7  PROT 6.2*  ALBUMIN 3.6   No results for input(s): LIPASE, AMYLASE in the last 168 hours. No results for input(s): AMMONIA in the last 168 hours. CBC: Recent Labs  Lab 10/07/2021 1946 09/12/2021 2048 09/26/2021 2138  WBC  --  8.1  --   NEUTROABS  --  6.1  --   HGB 11.6* 11.1* 11.2*  HCT 34.0* 34.2* 33.0*  MCV  --  96.3  --   PLT  --  198  --    Cardiac Enzymes: No results for input(s): CKTOTAL, CKMB, CKMBINDEX, TROPONINI in the last 168 hours. Sepsis Labs: Recent Labs  Lab 10/03/2021 2048  WBC 8.1   Procedures/Operations   None  Lestine Mount 2021-10-09, 8:11 AM

## 2021-10-11 NOTE — Progress Notes (Signed)
Elink paged second time. Told that MD Agarwala, Einar Grad would be signing death certificate. Patient ready to be taken to morgue. Patient placement called to verify checklist.

## 2021-10-11 NOTE — Progress Notes (Signed)
Patient expired at [place] confirmed with Blanch Media, RN. Witnessed by family at bedside. ELink MD paged. Waiting on response.

## 2021-10-11 DEATH — deceased

## 2022-05-23 IMAGING — CR DG HIP (WITH OR WITHOUT PELVIS) 2-3V*R*
2 series · 2 of 2 positions shown · non-contrast
Comparison: Lumbar spine radiographs, 01/28/2020

CLINICAL DATA: Intermittent posterior right hip pain for 3 days. No
injury.

EXAM:
DG HIP (WITH OR WITHOUT PELVIS) 2V RIGHT

[w hip ap right]
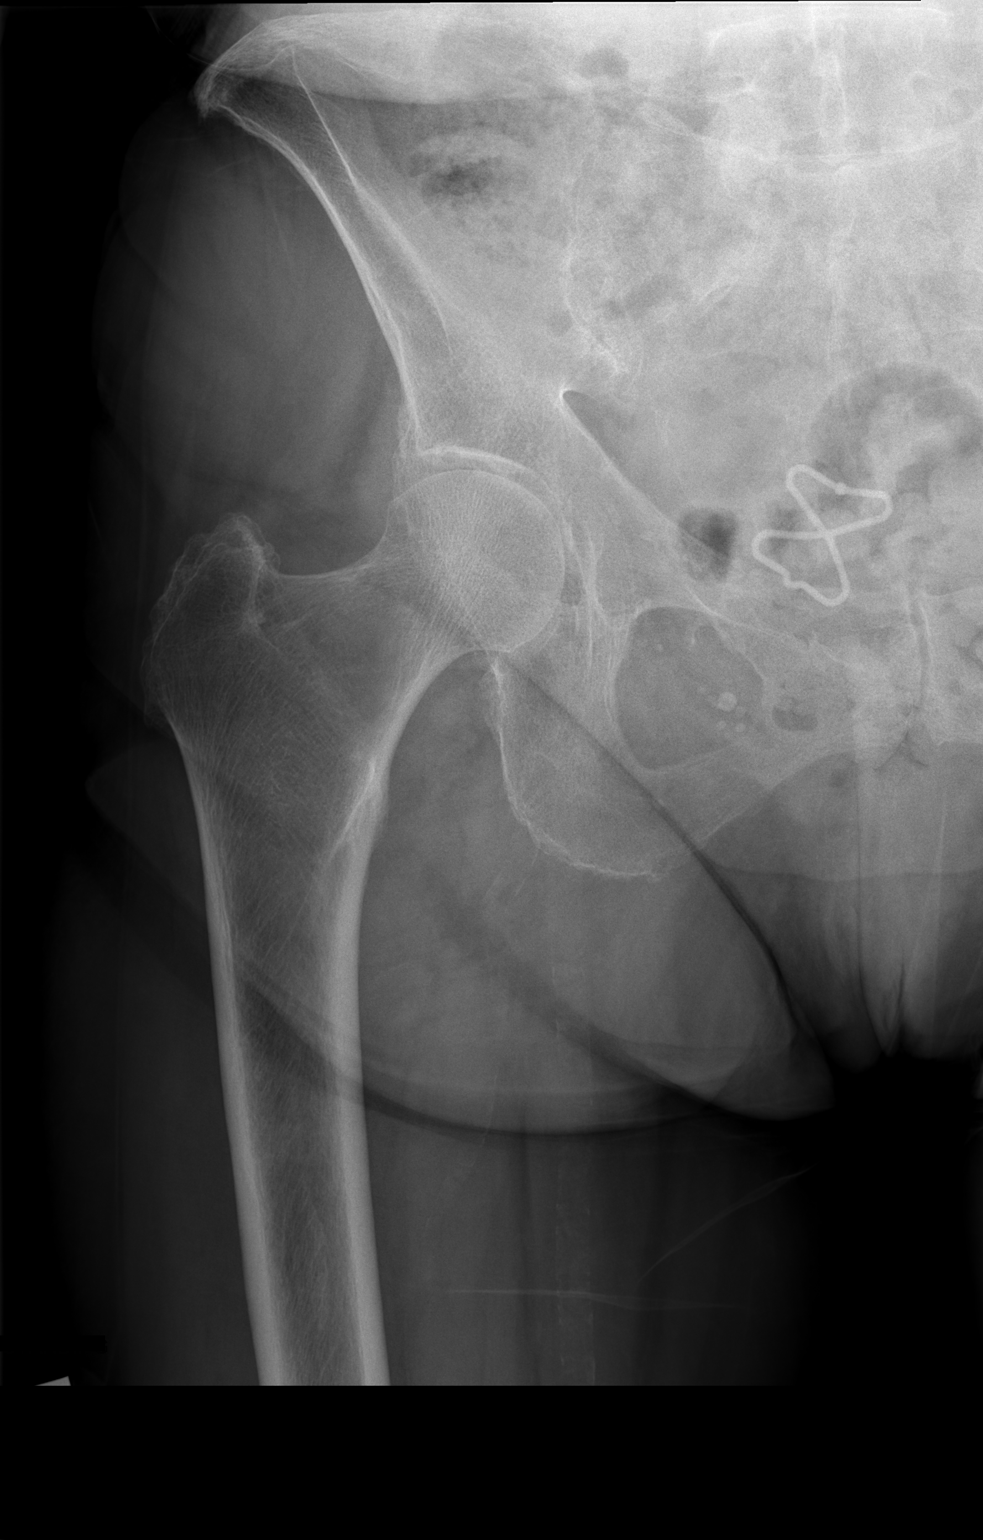

[w hip frog right]
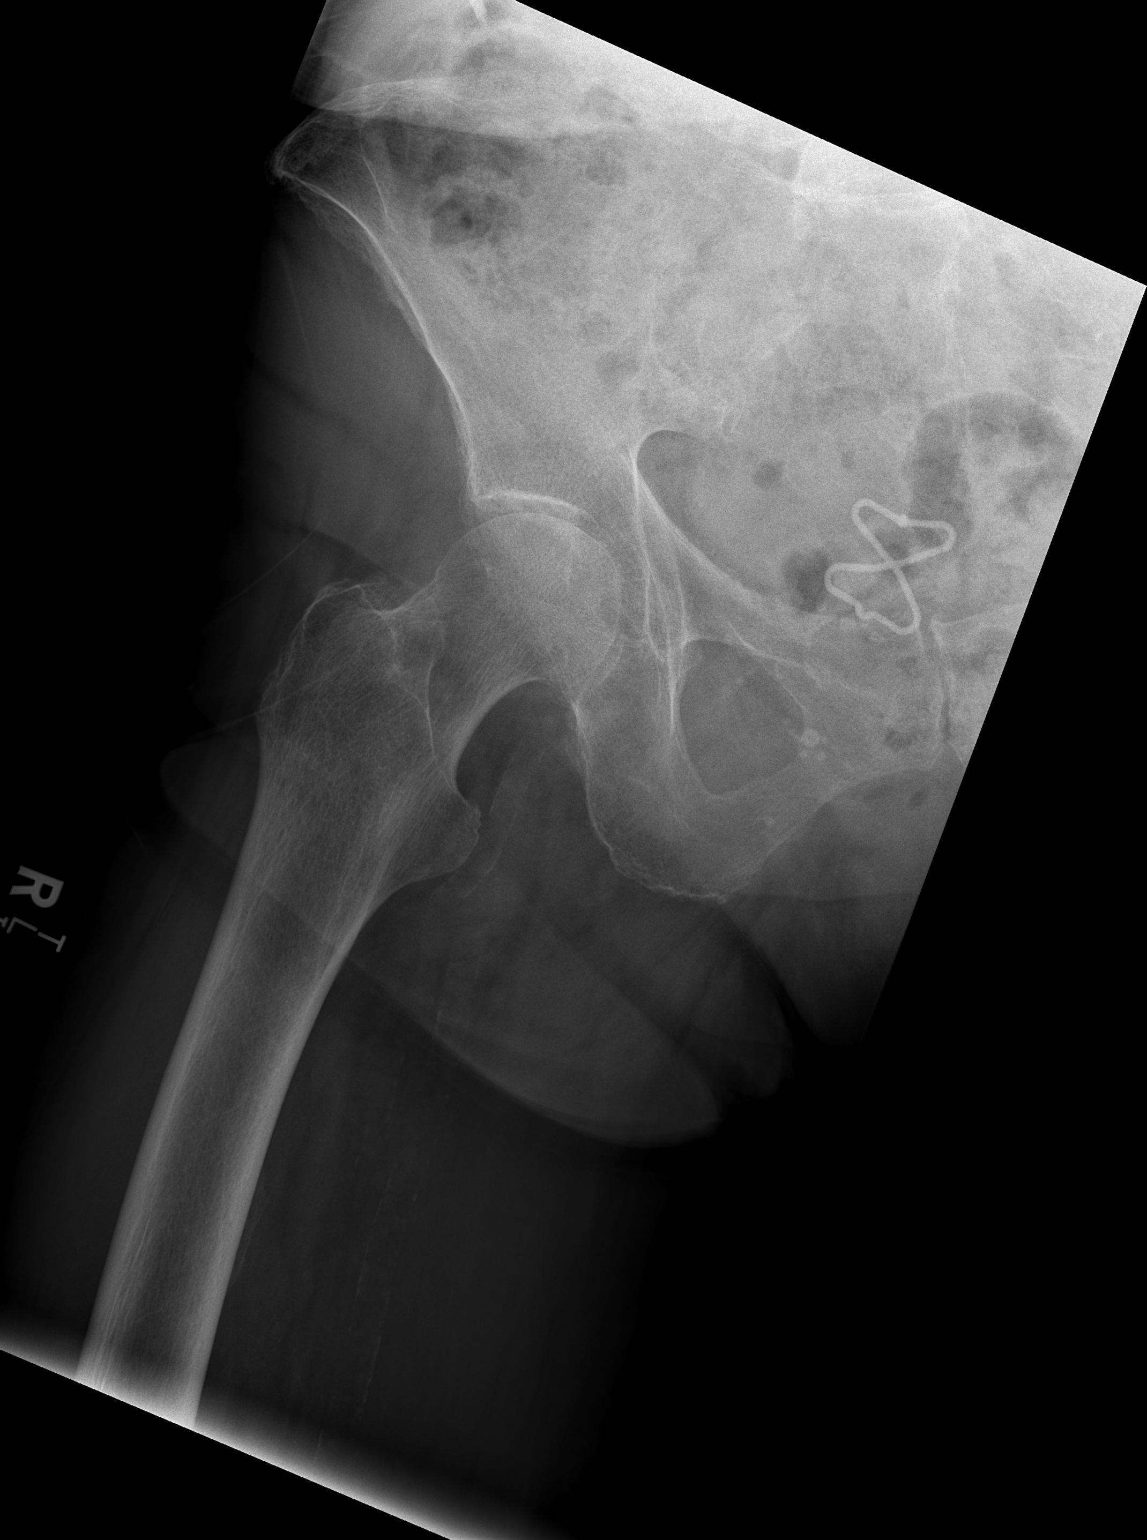

[2 of 2 positions shown; findings below may reference images not displayed]

FINDINGS: No fracture or bone lesion.

Hip joint is normally spaced and aligned. No significant
arthropathic changes.
IMPRESSION: 1. No fracture or bone lesion.
2. No hip joint abnormality.
